# Patient Record
Sex: Male | Born: 1945 | Race: Black or African American | Hispanic: No | Marital: Single | Smoking: Never smoker
Health system: Southern US, Community
[De-identification: ages and names within clinical notes are randomized; demographics above are authoritative.]

## PROBLEM LIST (undated history)

## (undated) DIAGNOSIS — I10 Essential (primary) hypertension: Secondary | ICD-10-CM

## (undated) DIAGNOSIS — C801 Malignant (primary) neoplasm, unspecified: Secondary | ICD-10-CM

---

## 2011-06-12 LAB — POC HEMOCCULT BLD/STL (HOME/3-CARD/SCREEN)

## 2012-04-01 ENCOUNTER — Telehealth: Payer: Self-pay | Admitting: Oncology

## 2012-04-01 NOTE — Telephone Encounter (Signed)
S/W pt in re NP apt 12/03 @ 1:30 w/Dr. Truett Perna.  Welcome packet mailed.

## 2012-04-01 NOTE — Telephone Encounter (Signed)
C/D 04/01/12 for appt. 04/20/12

## 2012-04-20 ENCOUNTER — Encounter: Payer: Self-pay | Admitting: Oncology

## 2012-04-20 ENCOUNTER — Ambulatory Visit (HOSPITAL_BASED_OUTPATIENT_CLINIC_OR_DEPARTMENT_OTHER): Payer: Medicaid - Out of State | Admitting: Oncology

## 2012-04-20 ENCOUNTER — Telehealth: Payer: Self-pay | Admitting: Oncology

## 2012-04-20 ENCOUNTER — Encounter (HOSPITAL_BASED_OUTPATIENT_CLINIC_OR_DEPARTMENT_OTHER): Payer: Medicaid - Out of State | Admitting: *Deleted

## 2012-04-20 ENCOUNTER — Other Ambulatory Visit: Payer: Self-pay | Admitting: *Deleted

## 2012-04-20 ENCOUNTER — Ambulatory Visit: Payer: Medicaid - Out of State

## 2012-04-20 VITALS — BP 133/67 | HR 58 | Temp 96.8°F | Resp 18 | Ht 75.0 in | Wt 181.0 lb

## 2012-04-20 DIAGNOSIS — C189 Malignant neoplasm of colon, unspecified: Secondary | ICD-10-CM

## 2012-04-20 DIAGNOSIS — C787 Secondary malignant neoplasm of liver and intrahepatic bile duct: Secondary | ICD-10-CM

## 2012-04-20 DIAGNOSIS — G620 Drug-induced polyneuropathy: Secondary | ICD-10-CM

## 2012-04-20 DIAGNOSIS — D509 Iron deficiency anemia, unspecified: Secondary | ICD-10-CM

## 2012-04-20 DIAGNOSIS — C182 Malignant neoplasm of ascending colon: Secondary | ICD-10-CM

## 2012-04-20 LAB — CBC WITH DIFFERENTIAL/PLATELET
BASO%: 0.5 % (ref 0.0–2.0)
Eosinophils Absolute: 0.2 10*3/uL (ref 0.0–0.5)
LYMPH%: 32.6 % (ref 14.0–49.0)
MCHC: 31.7 g/dL — ABNORMAL LOW (ref 32.0–36.0)
MONO#: 1.3 10*3/uL — ABNORMAL HIGH (ref 0.1–0.9)
NEUT#: 3.1 10*3/uL (ref 1.5–6.5)
RBC: 3.71 10*6/uL — ABNORMAL LOW (ref 4.20–5.82)
RDW: 23.5 % — ABNORMAL HIGH (ref 11.0–14.6)
WBC: 6.7 10*3/uL (ref 4.0–10.3)
lymph#: 2.2 10*3/uL (ref 0.9–3.3)

## 2012-04-20 LAB — CEA: CEA: 56.5 ng/mL — ABNORMAL HIGH (ref 0.0–5.0)

## 2012-04-20 LAB — COMPREHENSIVE METABOLIC PANEL (CC13)
ALT: 24 U/L (ref 0–55)
AST: 38 U/L — ABNORMAL HIGH (ref 5–34)
Albumin: 4 g/dL (ref 3.5–5.0)
Alkaline Phosphatase: 118 U/L (ref 40–150)
BUN: 28 mg/dL — ABNORMAL HIGH (ref 7.0–26.0)
Chloride: 98 mEq/L (ref 98–107)
Potassium: 4.5 mEq/L (ref 3.5–5.1)
Sodium: 131 mEq/L — ABNORMAL LOW (ref 136–145)

## 2012-04-20 LAB — UA PROTEIN, DIPSTICK - CHCC: Protein, ur: NEGATIVE mg/dL

## 2012-04-20 NOTE — Progress Notes (Signed)
Checked in new pt with no financial concerns. °

## 2012-04-20 NOTE — Progress Notes (Signed)
New pt seeing Dr. Truett Perna was very upset today.  Pt has Medicaid [place] and per pt he was told that his appts and chemo would be covered.  I called Medicaid of [place] to verify and we are not in network with them.  Rep stated that services would be covered only in the state of [place].  I informed the pt of this and he was very distraught saying that he checked on all of this before coming to [facility].  I tried explaining the financial assistance program to him, gave him an EPP application as well as a Medicaid of [place] application.  Patient didn't want to hear anything I was saying so I apologized to the pt for what he was going thru, offered to get upper management which he denied, I left the paperwork and exited the room.  I relayed this to Raquel so she can follow up.
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
## 2012-04-20 NOTE — Telephone Encounter (Signed)
gve the pt his dec 2013 appt calendar along with the oral contrast for the ct scan appt. Pt has the appt to see the nutrionist. S/w abigail the sw who is aware that the pt needs to see her asap

## 2012-04-20 NOTE — Progress Notes (Signed)
Met with patient and explained role of nurse navigator. Patient is relocating to the area from [place] and plans to continue his treatments under care of Dr. Truett Perna.  Patient upset about his finances.  Patient met with FC to work out his concerns.  Referrals made to SW and dietician.  Contact numbers given for staff and resources.  This RN contacted patient's former provider's office to obtain CT scans (which will be mailed out) and to request tissue blocks from original surgery Jan. 2013.  Will continue to follow for needs.

## 2012-04-21 ENCOUNTER — Telehealth: Payer: Self-pay | Admitting: *Deleted

## 2012-04-21 ENCOUNTER — Other Ambulatory Visit: Payer: Self-pay | Admitting: Lab

## 2012-04-21 DIAGNOSIS — C189 Malignant neoplasm of colon, unspecified: Secondary | ICD-10-CM

## 2012-04-21 LAB — FERRITIN: Ferritin: 25 ng/mL (ref 22–322)

## 2012-04-21 NOTE — Telephone Encounter (Signed)
Looked at the patient's schedule and he has been scheduled for his appointments

## 2012-04-22 ENCOUNTER — Encounter: Payer: Self-pay | Admitting: Oncology

## 2012-04-22 NOTE — Progress Notes (Signed)
Returned Mr. Fanning called (x2), no answer left message to call me back.Called [facility] [phone]) spoke with Loney Hering regarding his [place] Medicaid. Per Loney Hering  Mr. Mathes stated he will be working on transferring his Medicaid to [place] with the help of his sister who is a Engineer, civil (consulting). Lenise discussed this issue with Mr. Yamada and advised he will be self pay for all visits until his [place] Medicaid is approve.

## 2012-04-22 NOTE — Progress Notes (Signed)
Received CT scans on disks from previous hospital in [place] where treated.  These were delivered to Radiology Rebecka Apley).  These are to be compared to the CT scan scheduled for 04/23/12.  A request was made for  the disks be returned back to this RN.  Received tumor block from pathology department at previous hospital in [place].  This was sent to [facility] pathology lab and given to Javata Epps who will process and send off for KRAS testing per order from Dr. Truett Perna.

## 2012-04-23 ENCOUNTER — Ambulatory Visit (HOSPITAL_COMMUNITY)
Admission: RE | Admit: 2012-04-23 | Discharge: 2012-04-23 | Disposition: A | Discharge status: Home / Self Care | Payer: Self-pay | Source: Ambulatory Visit | Attending: Oncology | Admitting: Oncology

## 2012-04-23 ENCOUNTER — Other Ambulatory Visit: Payer: Self-pay | Admitting: *Deleted

## 2012-04-23 ENCOUNTER — Other Ambulatory Visit (HOSPITAL_COMMUNITY): Payer: Medicaid - Out of State

## 2012-04-23 ENCOUNTER — Ambulatory Visit (HOSPITAL_BASED_OUTPATIENT_CLINIC_OR_DEPARTMENT_OTHER): Payer: Medicaid - Out of State

## 2012-04-23 ENCOUNTER — Telehealth: Payer: Self-pay | Admitting: *Deleted

## 2012-04-23 DIAGNOSIS — C189 Malignant neoplasm of colon, unspecified: Secondary | ICD-10-CM

## 2012-04-23 DIAGNOSIS — C182 Malignant neoplasm of ascending colon: Secondary | ICD-10-CM

## 2012-04-23 LAB — BASIC METABOLIC PANEL (CC13)
BUN: 23 mg/dL (ref 7.0–26.0)
Calcium: 9.3 mg/dL (ref 8.4–10.4)
Chloride: 103 mEq/L (ref 98–107)
Creatinine: 1.5 mg/dL — ABNORMAL HIGH (ref 0.7–1.3)

## 2012-04-23 MED ORDER — IOHEXOL 300 MG/ML  SOLN
100.0000 mL | Freq: Once | INTRAMUSCULAR | Status: AC | PRN
  Administered 2012-04-23: 100 mL via INTRAVENOUS

## 2012-04-23 NOTE — Telephone Encounter (Signed)
Message copied by Gala Romney on Fri Apr 23, 2012  1:29 PM ------      Message from: Thornton Papas B      Created: Wed Apr 21, 2012 10:43 PM       Scheduled for CT 12/6, needs repeat bmet 12/5 or stat 12/6 prior to CT

## 2012-04-23 NOTE — Telephone Encounter (Signed)
Called and left message for pt to come in for lab work prior to CT Scan today at 3:30.

## 2012-04-26 ENCOUNTER — Telehealth: Payer: Self-pay | Admitting: *Deleted

## 2012-04-26 ENCOUNTER — Other Ambulatory Visit: Payer: Self-pay | Admitting: Oncology

## 2012-04-26 ENCOUNTER — Ambulatory Visit
Admission: RE | Admit: 2012-04-26 | Discharge: 2012-04-26 | Disposition: A | Discharge status: Home / Self Care | Payer: Self-pay | Source: Ambulatory Visit | Attending: Oncology | Admitting: Oncology

## 2012-04-26 ENCOUNTER — Ambulatory Visit (HOSPITAL_BASED_OUTPATIENT_CLINIC_OR_DEPARTMENT_OTHER): Payer: Medicaid - Out of State | Admitting: Nurse Practitioner

## 2012-04-26 VITALS — BP 162/83 | HR 52 | Temp 98.5°F | Resp 20 | Ht 75.0 in | Wt 188.3 lb

## 2012-04-26 DIAGNOSIS — C229 Malignant neoplasm of liver, not specified as primary or secondary: Secondary | ICD-10-CM

## 2012-04-26 DIAGNOSIS — G620 Drug-induced polyneuropathy: Secondary | ICD-10-CM

## 2012-04-26 DIAGNOSIS — C787 Secondary malignant neoplasm of liver and intrahepatic bile duct: Secondary | ICD-10-CM

## 2012-04-26 DIAGNOSIS — C189 Malignant neoplasm of colon, unspecified: Secondary | ICD-10-CM

## 2012-04-26 DIAGNOSIS — C182 Malignant neoplasm of ascending colon: Secondary | ICD-10-CM

## 2012-04-26 DIAGNOSIS — D509 Iron deficiency anemia, unspecified: Secondary | ICD-10-CM
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
## 2012-04-26 NOTE — Telephone Encounter (Signed)
CT scans done 04/24/12 need to be compared to outside scans per request of Dr. Truett Perna.  Spoke with Rebecka Apley in Radiology.  She is aware patient comes in today and that Dr. Truett Perna needs the information.

## 2012-04-27 ENCOUNTER — Telehealth: Payer: Self-pay | Admitting: *Deleted

## 2012-04-27 ENCOUNTER — Encounter: Payer: Self-pay | Admitting: *Deleted

## 2012-04-27 DIAGNOSIS — C189 Malignant neoplasm of colon, unspecified: Secondary | ICD-10-CM

## 2012-04-27 NOTE — Telephone Encounter (Signed)
Made patient appointment for 05-17-2012 starting at 9:00am   Sent michelle email to set up patient's treatment on 05-03-2012 and 05-17-2012

## 2012-04-27 NOTE — Telephone Encounter (Signed)
Patient confirmed over the phone the new date and time on 05-04-2012 at 9:30am

## 2012-04-27 NOTE — Telephone Encounter (Signed)
Per staff message and POF I have scheduled appts. Per Dr. Truett Perna ok to move to 12/17.  JMW

## 2012-04-28 ENCOUNTER — Telehealth: Payer: Self-pay | Admitting: Oncology

## 2012-04-28 ENCOUNTER — Encounter: Payer: Medicaid - Out of State | Admitting: *Deleted

## 2012-04-28 ENCOUNTER — Other Ambulatory Visit: Payer: Medicaid - Out of State | Admitting: Lab

## 2012-04-28 ENCOUNTER — Ambulatory Visit: Payer: Medicaid - Out of State | Admitting: Nutrition

## 2012-04-28 ENCOUNTER — Other Ambulatory Visit: Payer: Self-pay | Admitting: *Deleted

## 2012-04-28 NOTE — Telephone Encounter (Signed)
Chemo class made and printed for [facility]

## 2012-04-28 NOTE — Progress Notes (Signed)
Patient is a 66 year old male diagnosed with metastatic colon cancer receiving FOLFIRI. He is a patient of Dr. Nida Boatman Sherrill's.  Past medical history includes a right colectomy and microcytic anemia.  Medications include Prozac, Ativan, and Phenergan.  Labs include sodium of 135 and creatinine of 1.5 on December 6.  Height: 6 feet 3 inches. Weight: 188.3 pounds December 9. Usual body weight: 190-195 pounds. BMI: 23.54.  Patient reports he lost 60 pounds approximately one year ago. It has taken him all this time to regain his weight and to feel better. He is to begin chemotherapy this month. Patient reports dietary recall of a protein juice drink at breakfast and lunch. He consumes snacks utilizing higher protein foods throughout the day. He typically eats a normal dinner consisting of protein, vegetables, and carbohydrates. He has no side effects at this time. He does utilize a whey protein powder providing 60 g of protein daily.  Diagnosis: Food and nutrition related knowledge deficit related to diagnosis of metastatic colon cancer and associated treatments as evidenced by lack of prior exposure to accurate nutrition related formation.  Intervention: I educated patient on important protein sources in his diet. I have encouraged him to consume small frequent meals and snacks throughout the day. He could benefit from the additional protein that the protein powder is providing so he is to continue this for now. I have provided education on potential side effects and how to manage them nutritionally. He has fact sheets in my contact information for questions.  Monitoring, evaluation, goals: Patient will tolerate oral diet to promote weight maintenance throughout treatment.  Next visit: Monday, December 30, during chemotherapy.

## 2012-04-29 ENCOUNTER — Encounter: Payer: Self-pay | Admitting: *Deleted

## 2012-04-29 ENCOUNTER — Ambulatory Visit: Payer: Medicaid - Out of State

## 2012-04-29 DIAGNOSIS — C189 Malignant neoplasm of colon, unspecified: Secondary | ICD-10-CM

## 2012-04-29 MED ORDER — FLUOXETINE HCL 20 MG PO CAPS: 20.0000 mg | ORAL_CAPSULE | Freq: Every day | ORAL | Status: DC

## 2012-04-30 ENCOUNTER — Encounter: Payer: Self-pay | Admitting: Oncology

## 2012-04-30 NOTE — Progress Notes (Signed)
Will have patient bring letter of support in for his financial application. He gets 730.00 per month, but his rent is 600.00 who helps with other bills. He is back in office 05/04/12.

## 2012-04-30 NOTE — Progress Notes (Signed)
Processed patient's application for assistance. Patient is new in town and lives with his sister. 1 family income 25.  I will send letter/green card 100% 04/30/12-10/29/12.

## 2012-05-04 ENCOUNTER — Other Ambulatory Visit: Payer: Self-pay | Admitting: Oncology

## 2012-05-04 ENCOUNTER — Ambulatory Visit (HOSPITAL_BASED_OUTPATIENT_CLINIC_OR_DEPARTMENT_OTHER): Payer: Medicaid - Out of State

## 2012-05-04 ENCOUNTER — Encounter: Payer: Self-pay | Admitting: Nutrition

## 2012-05-04 ENCOUNTER — Other Ambulatory Visit: Payer: Self-pay | Admitting: Medical Oncology

## 2012-05-04 VITALS — BP 148/76 | HR 58 | Temp 97.0°F | Resp 20

## 2012-05-04 DIAGNOSIS — C189 Malignant neoplasm of colon, unspecified: Secondary | ICD-10-CM

## 2012-05-04 DIAGNOSIS — Z5111 Encounter for antineoplastic chemotherapy: Secondary | ICD-10-CM

## 2012-05-04 DIAGNOSIS — C787 Secondary malignant neoplasm of liver and intrahepatic bile duct: Secondary | ICD-10-CM

## 2012-05-04 DIAGNOSIS — C182 Malignant neoplasm of ascending colon: Secondary | ICD-10-CM

## 2012-05-04 MED ORDER — SODIUM CHLORIDE 0.9 % IJ SOLN
10.0000 mL | INTRAMUSCULAR | Status: DC | PRN
  Filled 2012-05-04: qty 10

## 2012-05-04 MED ORDER — SODIUM CHLORIDE 0.9 % IV SOLN
Freq: Once | INTRAVENOUS | Status: AC
  Administered 2012-05-04: 10:00:00 via INTRAVENOUS

## 2012-05-04 MED ORDER — HEPARIN SOD (PORK) LOCK FLUSH 100 UNIT/ML IV SOLN
500.0000 [IU] | Freq: Once | INTRAVENOUS | Status: DC | PRN
  Filled 2012-05-04: qty 5

## 2012-05-04 MED ORDER — ATROPINE SULFATE 1 MG/ML IJ SOLN: 0.5000 mg | Freq: Once | INTRAMUSCULAR | Status: DC | PRN

## 2012-05-04 MED ORDER — DEXAMETHASONE SODIUM PHOSPHATE 4 MG/ML IJ SOLN
20.0000 mg | Freq: Once | INTRAMUSCULAR | Status: AC
  Administered 2012-05-04: 20 mg via INTRAVENOUS

## 2012-05-04 MED ORDER — IRINOTECAN HCL CHEMO INJECTION 100 MG/5ML
180.0000 mg/m2 | Freq: Once | INTRAVENOUS | Status: AC
  Administered 2012-05-04: 384 mg via INTRAVENOUS
  Filled 2012-05-04: qty 19.2

## 2012-05-04 MED ORDER — FLUOROURACIL CHEMO INJECTION 2.5 GM/50ML
400.0000 mg/m2 | Freq: Once | INTRAVENOUS | Status: AC
  Administered 2012-05-04: 850 mg via INTRAVENOUS
  Filled 2012-05-04: qty 17

## 2012-05-04 MED ORDER — SODIUM CHLORIDE 0.9 % IV SOLN
2400.0000 mg/m2 | INTRAVENOUS | Status: DC
  Administered 2012-05-04: 5100 mg via INTRAVENOUS
  Filled 2012-05-04: qty 102

## 2012-05-04 MED ORDER — LEUCOVORIN CALCIUM INJECTION 350 MG
850.0000 mg | Freq: Once | INTRAVENOUS | Status: AC
  Administered 2012-05-04: 850 mg via INTRAVENOUS
  Filled 2012-05-04: qty 42.5

## 2012-05-04 MED ORDER — ONDANSETRON 16 MG/50ML IVPB (CHCC)
16.0000 mg | Freq: Once | INTRAVENOUS | Status: AC
  Administered 2012-05-04: 16 mg via INTRAVENOUS

## 2012-05-04 NOTE — Patient Instructions (Addendum)
[facility] Discharge Instructions for Patients Receiving Chemotherapy  Today you received the following chemotherapy agents folfiri  To help prevent nausea and vomiting after your treatment, we encourage you to take your nausea medication  and take it as often as prescribed    If you develop nausea and vomiting that is not controlled by your nausea medication, call the clinic. If it is after clinic hours your family physician or the after hours number for the clinic or go to the Emergency Department.   BELOW ARE SYMPTOMS THAT SHOULD BE REPORTED IMMEDIATELY:  *FEVER GREATER THAN 100.5 F  *CHILLS WITH OR WITHOUT FEVER  NAUSEA AND VOMITING THAT IS NOT CONTROLLED WITH YOUR NAUSEA MEDICATION  *UNUSUAL SHORTNESS OF BREATH  *UNUSUAL BRUISING OR BLEEDING  TENDERNESS IN MOUTH AND THROAT WITH OR WITHOUT PRESENCE OF ULCERS  *URINARY PROBLEMS  *BOWEL PROBLEMS  UNUSUAL RASH Items with * indicate a potential emergency and should be followed up as soon as possible.  One of the nurses will contact you 24 hours after your treatment. Please let the nurse know about any problems that you may have experienced. Feel free to call the clinic you have any questions or concerns. The clinic phone number is [phone].   I have been informed and understand all the instructions given to me. I know to contact the clinic, my physician, or go to the Emergency Department if any problems should occur. I do not have any questions at this time, but understand that I may call the clinic during office hours   should I have any questions or need assistance in obtaining follow up care.    __________________________________________  _____________  __________ Signature of Patient or Authorized Representative            Date                   Time    __________________________________________ Nurse's Signature

## 2012-05-04 NOTE — Progress Notes (Signed)
Social worker made me aware patient is out of protein powder. I visited patient in the chemotherapy area who reports finances are a problem for him. I have provided some samples of protein powder along with one complementary case of Ensure Plus. Patient expresses appreciation.  Next visit: Monday, December 30, during chemotherapy.

## 2012-05-05 ENCOUNTER — Encounter: Payer: Self-pay | Admitting: Oncology

## 2012-05-05 ENCOUNTER — Telehealth: Payer: Self-pay | Admitting: *Deleted

## 2012-05-05 NOTE — Progress Notes (Signed)
Patient came in and I advised him again he has the grant 400.00 and has already used some of it. He has received meds and we(Lenise) gave him a gas card 50.00. He inquired about getting rent paid. I advised him need copy of his rental agreement with his name on it. I also gave him the w-9 for his landlord to fill out. I advised him 2-3 weeks for processing. Patient was advised that only cancer treatment meds can be purchased with the [facility] fund. He wanted to get blood pressure and some other with it. I advised him must be prescribed by our doctors to use at pharmacy.

## 2012-05-05 NOTE — Telephone Encounter (Signed)
Received message from pt requesting re-fill of meds.  Called and spoke with pt---he is requesting re-fill of Cardizem.  States he can't get in to see Dr. Chilton Si until January and wants Dr. Truett Perna to refill.  Informed pt that he would have to get his PCP from [place] to re-order this medicine;  Explained that Dr. Truett Perna is following pt's oncology issues and his PCP or cardiologist from [place] would need to address this.  Pt verbalized understanding.

## 2012-05-06 ENCOUNTER — Other Ambulatory Visit: Payer: Self-pay | Admitting: *Deleted

## 2012-05-06 ENCOUNTER — Ambulatory Visit (HOSPITAL_BASED_OUTPATIENT_CLINIC_OR_DEPARTMENT_OTHER): Payer: Medicaid - Out of State

## 2012-05-06 ENCOUNTER — Telehealth: Payer: Self-pay | Admitting: Oncology

## 2012-05-06 VITALS — BP 173/89 | HR 68 | Temp 98.2°F

## 2012-05-06 DIAGNOSIS — C182 Malignant neoplasm of ascending colon: Secondary | ICD-10-CM

## 2012-05-06 DIAGNOSIS — C787 Secondary malignant neoplasm of liver and intrahepatic bile duct: Secondary | ICD-10-CM

## 2012-05-06 DIAGNOSIS — C189 Malignant neoplasm of colon, unspecified: Secondary | ICD-10-CM

## 2012-05-06 DIAGNOSIS — Z452 Encounter for adjustment and management of vascular access device: Secondary | ICD-10-CM

## 2012-05-06 MED ORDER — GABAPENTIN 300 MG PO CAPS: 300.0000 mg | ORAL_CAPSULE | Freq: Every day | ORAL | Status: DC

## 2012-05-06 MED ORDER — SODIUM CHLORIDE 0.9 % IJ SOLN
10.0000 mL | INTRAMUSCULAR | Status: DC | PRN
  Administered 2012-05-06: 10 mL
  Filled 2012-05-06: qty 10

## 2012-05-06 MED ORDER — HEPARIN SOD (PORK) LOCK FLUSH 100 UNIT/ML IV SOLN
500.0000 [IU] | Freq: Once | INTRAVENOUS | Status: AC | PRN
  Administered 2012-05-06: 500 [IU]
  Filled 2012-05-06: qty 5

## 2012-05-06 MED ORDER — SODIUM CHLORIDE 0.9 % IJ SOLN
10.0000 mL | INTRAMUSCULAR | Status: DC | PRN
  Filled 2012-05-06: qty 10

## 2012-05-06 MED ORDER — HEPARIN SOD (PORK) LOCK FLUSH 100 UNIT/ML IV SOLN
500.0000 [IU] | Freq: Once | INTRAVENOUS | Status: DC
  Filled 2012-05-06: qty 5

## 2012-05-06 NOTE — Telephone Encounter (Signed)
Added pump d/c appt for today. Pt tx'd 12/17 and did not have a d/c appt. Also moved 12/30 folfox appt to 12/31 due to center is closed 05/19/12 and pump cannot be stopped that day. Pt aware of changes and given new appts schedule for December and January (05/17/12, 05/18/12, and 05/20/12).

## 2012-05-16 ENCOUNTER — Other Ambulatory Visit: Payer: Self-pay | Admitting: Oncology

## 2012-05-17 ENCOUNTER — Other Ambulatory Visit: Payer: Self-pay | Admitting: Oncology

## 2012-05-17 ENCOUNTER — Encounter: Payer: Medicaid - Out of State | Admitting: Nutrition

## 2012-05-17 ENCOUNTER — Telehealth: Payer: Self-pay | Admitting: Oncology

## 2012-05-17 ENCOUNTER — Telehealth: Payer: Self-pay | Admitting: *Deleted

## 2012-05-17 ENCOUNTER — Other Ambulatory Visit (HOSPITAL_BASED_OUTPATIENT_CLINIC_OR_DEPARTMENT_OTHER): Payer: Medicaid - Out of State | Admitting: Lab

## 2012-05-17 ENCOUNTER — Ambulatory Visit (HOSPITAL_BASED_OUTPATIENT_CLINIC_OR_DEPARTMENT_OTHER): Payer: Medicaid - Out of State | Admitting: Oncology

## 2012-05-17 ENCOUNTER — Inpatient Hospital Stay: Payer: Medicaid - Out of State

## 2012-05-17 VITALS — BP 153/70 | HR 58 | Temp 97.9°F | Resp 18 | Ht 75.0 in | Wt 185.5 lb

## 2012-05-17 DIAGNOSIS — C182 Malignant neoplasm of ascending colon: Secondary | ICD-10-CM

## 2012-05-17 DIAGNOSIS — C189 Malignant neoplasm of colon, unspecified: Secondary | ICD-10-CM

## 2012-05-17 DIAGNOSIS — C787 Secondary malignant neoplasm of liver and intrahepatic bile duct: Secondary | ICD-10-CM

## 2012-05-17 DIAGNOSIS — D649 Anemia, unspecified: Secondary | ICD-10-CM

## 2012-05-17 DIAGNOSIS — G620 Drug-induced polyneuropathy: Secondary | ICD-10-CM

## 2012-05-17 LAB — CBC WITH DIFFERENTIAL/PLATELET
BASO%: 0.5 % (ref 0.0–2.0)
EOS%: 6.1 % (ref 0.0–7.0)
MCH: 25.1 pg — ABNORMAL LOW (ref 27.2–33.4)
MCHC: 32.1 g/dL (ref 32.0–36.0)
RBC: 3.88 10*6/uL — ABNORMAL LOW (ref 4.20–5.82)
RDW: 22.6 % — ABNORMAL HIGH (ref 11.0–14.6)
lymph#: 1.7 10*3/uL (ref 0.9–3.3)

## 2012-05-17 LAB — COMPREHENSIVE METABOLIC PANEL (CC13)
ALT: 30 U/L (ref 0–55)
AST: 37 U/L — ABNORMAL HIGH (ref 5–34)
Albumin: 3.5 g/dL (ref 3.5–5.0)
Calcium: 9.1 mg/dL (ref 8.4–10.4)
Chloride: 106 mEq/L (ref 98–107)
Creatinine: 1.2 mg/dL (ref 0.7–1.3)
Potassium: 4.6 mEq/L (ref 3.5–5.1)
Sodium: 139 mEq/L (ref 136–145)

## 2012-05-17 NOTE — Progress Notes (Signed)
   Nathan Robbins    OFFICE PROGRESS NOTE   INTERVAL HISTORY:   He completed a first cycle of FOLFIRI on 02/03/2012. Nathan Robbins reports tolerating the chemotherapy well. He denies mouth sores, nausea, and diarrhea. Persistent neuropathy symptoms in the feet. He feels well.  Objective:  Vital signs in last 24 hours:  Blood pressure 153/70, pulse 58, temperature 97.9 F (36.6 C), temperature source Oral, resp. rate 18, height 6\' 3"  (1.905 m), weight 185 lb 8 oz (84.142 kg).    HEENT: No thrush or ulcers  Cardio: Regular rate and rhythm GI: The liver is palpable in the right upper abdomen, most prominent in the medial right subcostal region Vascular: No leg edema Lungs: Clear bilaterally   Portacath/PICC-without erythema  Lab Results:  Lab Results  Component Value Date   WBC 5.7 05/17/2012   HGB 9.7* 05/17/2012   HCT 30.3* 05/17/2012   MCV 78.1* 05/17/2012   PLT 314 05/17/2012   ANC 3.0  CEA on 04/20/2012-56.5    Medications: I have reviewed the patient's current medications.  Assessment/Plan: 1. Metastatic colon cancer diagnosed in January 2013, right colon mass (T3 N1), status post right colectomy and biopsy of a metastatic liver lesion. Status post 11 cycles of FOLFOX/Avastin with restaging CT on 01/15/2012 revealing stable metastatic disease involving the liver. Chemotherapy switched to weekly 5-FU/leucovorin and every 2 week Avastin beginning on 02/18/2012; last treated with 5-FU/leucovorin on 03/24/2012 and last treated with Avastin on 03/31/2012. Restaging CT evaluation 04/23/2012 showed increased hepatic metastatic disease. -Cycle 1 of FOLFIRI chemotherapy 05/04/2012 2. Microcytic anemia. Question iron deficiency. Ferritin was normal on 04/20/2012. 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 He  will complete cycle 2 today. Nathan Robbins will return for an office visit and chemotherapy in 2 weeks. We will obtain a restaging CT evaluation after 5-6 cycles of FOLFIRI.   Thornton Papas, MD  05/17/2012  6:30 PM

## 2012-05-17 NOTE — Telephone Encounter (Signed)
Gave pt appt for lab and MD for January 2014 , waiting for [facility] for chemo appt, pt will come back tomorrow to get full calendar

## 2012-05-17 NOTE — Telephone Encounter (Signed)
Per staff message and POF I have a schedueld appts. JMW

## 2012-05-18 ENCOUNTER — Telehealth: Payer: Self-pay | Admitting: *Deleted

## 2012-05-18 ENCOUNTER — Encounter: Payer: Medicaid - Out of State | Admitting: Nutrition

## 2012-05-18 ENCOUNTER — Inpatient Hospital Stay: Payer: Medicaid - Out of State

## 2012-05-18 NOTE — Telephone Encounter (Signed)
[facility] for chemo appointment today. Left VM for patient to call scheduling department to reschedule.

## 2012-05-20 ENCOUNTER — Telehealth: Payer: Self-pay | Admitting: *Deleted

## 2012-05-20 NOTE — Telephone Encounter (Signed)
Per staff phone call from desk RN, patient missed his appt on 12/31. Patient to receive foliri, I have sent message backa to desk RN to give date to schedule.  Cant schedule tomorrow sue to tomorrow is Friday and no pump d/c on Sundays. JMW

## 2012-05-20 NOTE — Telephone Encounter (Signed)
Left message on known voice mail regarding 05/26/12 appt due to cancellation on 05/18/12.  Instructed pt to call when message received to confirm appt date/time.

## 2012-05-20 NOTE — Telephone Encounter (Signed)
Per desk RN patient to to be scheduled next week. Appts made, desk RN to call patient.  JMW

## 2012-05-20 NOTE — Telephone Encounter (Signed)
Received call from pt requesting to reschedule chemo missed 05/18/12 due to sickness.  Message left on [facility] voice mail and POF completed.

## 2012-05-25 ENCOUNTER — Encounter: Payer: Self-pay | Admitting: Pharmacist

## 2012-05-26 ENCOUNTER — Other Ambulatory Visit (HOSPITAL_BASED_OUTPATIENT_CLINIC_OR_DEPARTMENT_OTHER): Payer: Medicaid - Out of State | Admitting: Lab

## 2012-05-26 ENCOUNTER — Ambulatory Visit (HOSPITAL_BASED_OUTPATIENT_CLINIC_OR_DEPARTMENT_OTHER): Payer: Medicaid - Out of State

## 2012-05-26 ENCOUNTER — Other Ambulatory Visit: Payer: Self-pay | Admitting: Oncology

## 2012-05-26 VITALS — BP 128/74 | HR 66 | Temp 98.7°F

## 2012-05-26 DIAGNOSIS — C182 Malignant neoplasm of ascending colon: Secondary | ICD-10-CM

## 2012-05-26 DIAGNOSIS — C189 Malignant neoplasm of colon, unspecified: Secondary | ICD-10-CM

## 2012-05-26 DIAGNOSIS — C787 Secondary malignant neoplasm of liver and intrahepatic bile duct: Secondary | ICD-10-CM

## 2012-05-26 DIAGNOSIS — Z5111 Encounter for antineoplastic chemotherapy: Secondary | ICD-10-CM

## 2012-05-26 LAB — CBC WITH DIFFERENTIAL/PLATELET
HCT: 30.9 % — ABNORMAL LOW (ref 38.4–49.9)
HGB: 9.7 g/dL — ABNORMAL LOW (ref 13.0–17.1)
LYMPH%: 27.3 % (ref 14.0–49.0)
MCH: 24.3 pg — ABNORMAL LOW (ref 27.2–33.4)
MCHC: 31.5 g/dL — ABNORMAL LOW (ref 32.0–36.0)
MONO#: 1.3 10*3/uL — ABNORMAL HIGH (ref 0.1–0.9)
MONO%: 20.5 % — ABNORMAL HIGH (ref 0.0–14.0)
NEUT%: 47.8 % (ref 39.0–75.0)
RDW: 21.8 % — ABNORMAL HIGH (ref 11.0–14.6)
WBC: 6.3 10*3/uL (ref 4.0–10.3)
lymph#: 1.7 10*3/uL (ref 0.9–3.3)

## 2012-05-26 LAB — COMPREHENSIVE METABOLIC PANEL (CC13)
Alkaline Phosphatase: 145 U/L (ref 40–150)
BUN: 14 mg/dL (ref 7.0–26.0)
Creatinine: 1.3 mg/dL (ref 0.7–1.3)
Glucose: 105 mg/dl — ABNORMAL HIGH (ref 70–99)
Sodium: 135 mEq/L — ABNORMAL LOW (ref 136–145)
Total Bilirubin: 0.23 mg/dL (ref 0.20–1.20)

## 2012-05-26 MED ORDER — DEXAMETHASONE SODIUM PHOSPHATE 10 MG/ML IJ SOLN
20.0000 mg | Freq: Once | INTRAMUSCULAR | Status: AC
  Administered 2012-05-26: 20 mg via INTRAVENOUS

## 2012-05-26 MED ORDER — ONDANSETRON 16 MG/50ML IVPB (CHCC)
16.0000 mg | Freq: Once | INTRAVENOUS | Status: AC
  Administered 2012-05-26: 16 mg via INTRAVENOUS

## 2012-05-26 MED ORDER — ATROPINE SULFATE 1 MG/ML IJ SOLN
0.5000 mg | Freq: Once | INTRAMUSCULAR | Status: AC | PRN
  Administered 2012-05-26: 0.5 mg via INTRAVENOUS

## 2012-05-26 MED ORDER — LEUCOVORIN CALCIUM INJECTION 350 MG
400.0000 mg/m2 | Freq: Once | INTRAVENOUS | Status: AC
  Administered 2012-05-26: 852 mg via INTRAVENOUS
  Filled 2012-05-26: qty 42.6

## 2012-05-26 MED ORDER — IRINOTECAN HCL CHEMO INJECTION 100 MG/5ML
180.0000 mg/m2 | Freq: Once | INTRAVENOUS | Status: AC
  Administered 2012-05-26: 384 mg via INTRAVENOUS
  Filled 2012-05-26: qty 19.2

## 2012-05-26 MED ORDER — SODIUM CHLORIDE 0.9 % IV SOLN
2400.0000 mg/m2 | INTRAVENOUS | Status: DC
  Administered 2012-05-26: 5100 mg via INTRAVENOUS
  Filled 2012-05-26: qty 102

## 2012-05-26 MED ORDER — SODIUM CHLORIDE 0.9 % IV SOLN
Freq: Once | INTRAVENOUS | Status: AC
  Administered 2012-05-26: 13:00:00 via INTRAVENOUS

## 2012-05-26 MED ORDER — FLUOROURACIL CHEMO INJECTION 2.5 GM/50ML
400.0000 mg/m2 | Freq: Once | INTRAVENOUS | Status: AC
  Administered 2012-05-26: 850 mg via INTRAVENOUS
  Filled 2012-05-26: qty 17

## 2012-05-26 NOTE — Patient Instructions (Addendum)
[facility] Discharge Instructions for Patients Receiving Chemotherapy  Today you received the following chemotherapy agents Camptosar, Leucovorin and 5FU  To help prevent nausea and vomiting after your treatment, we encourage you to take your nausea medication as prescribed.   If you develop nausea and vomiting that is not controlled by your nausea medication, call the clinic. If it is after clinic hours your family physician or the after hours number for the clinic or go to the Emergency Department.   BELOW ARE SYMPTOMS THAT SHOULD BE REPORTED IMMEDIATELY:  *FEVER GREATER THAN 100.5 F  *CHILLS WITH OR WITHOUT FEVER  NAUSEA AND VOMITING THAT IS NOT CONTROLLED WITH YOUR NAUSEA MEDICATION  *UNUSUAL SHORTNESS OF BREATH  *UNUSUAL BRUISING OR BLEEDING  TENDERNESS IN MOUTH AND THROAT WITH OR WITHOUT PRESENCE OF ULCERS  *URINARY PROBLEMS  *BOWEL PROBLEMS  UNUSUAL RASH Items with * indicate a potential emergency and should be followed up as soon as possible.  Feel free to call the clinic you have any questions or concerns. The clinic phone number is [phone].   I have been informed and understand all the instructions given to me. I know to contact the clinic, my physician, or go to the Emergency Department if any problems should occur. I do not have any questions at this time, but understand that I may call the clinic during office hours   should I have any questions or need assistance in obtaining follow up care.    __________________________________________  _____________  __________ Signature of Patient or Authorized Representative            Date                   Time    __________________________________________ Nurse's Signature    

## 2012-05-27 ENCOUNTER — Telehealth: Payer: Self-pay | Admitting: Oncology

## 2012-05-27 ENCOUNTER — Ambulatory Visit: Payer: Medicaid - Out of State | Admitting: Nurse Practitioner

## 2012-05-27 NOTE — Telephone Encounter (Signed)
Talked to patient, he will come and get calendar appt for rest of January 2014 tomorrow

## 2012-05-28 ENCOUNTER — Other Ambulatory Visit: Payer: Self-pay | Admitting: *Deleted

## 2012-05-28 ENCOUNTER — Ambulatory Visit (HOSPITAL_BASED_OUTPATIENT_CLINIC_OR_DEPARTMENT_OTHER): Payer: Medicaid - Out of State

## 2012-05-28 VITALS — BP 136/85 | HR 82 | Temp 97.5°F

## 2012-05-28 DIAGNOSIS — C787 Secondary malignant neoplasm of liver and intrahepatic bile duct: Secondary | ICD-10-CM

## 2012-05-28 DIAGNOSIS — C182 Malignant neoplasm of ascending colon: Secondary | ICD-10-CM

## 2012-05-28 DIAGNOSIS — C189 Malignant neoplasm of colon, unspecified: Secondary | ICD-10-CM

## 2012-05-28 MED ORDER — HEPARIN SOD (PORK) LOCK FLUSH 100 UNIT/ML IV SOLN
500.0000 [IU] | Freq: Once | INTRAVENOUS | Status: AC | PRN
  Administered 2012-05-28: 500 [IU]
  Filled 2012-05-28: qty 5

## 2012-05-28 MED ORDER — PROMETHAZINE HCL 25 MG PO TABS: 25.0000 mg | ORAL_TABLET | Freq: Three times a day (TID) | ORAL | Status: DC | PRN

## 2012-05-28 MED ORDER — SODIUM CHLORIDE 0.9 % IJ SOLN
10.0000 mL | INTRAMUSCULAR | Status: DC | PRN
  Administered 2012-05-28: 10 mL
  Filled 2012-05-28: qty 10

## 2012-05-28 NOTE — Telephone Encounter (Signed)
Call from patient while at pharmacy waiting for meds. Patient was instructed yesterday to call pharmacy for refill, now very agitated that pharmacy did not have this.

## 2012-05-28 NOTE — Telephone Encounter (Signed)
Message from pharmacy: pt is requesting refill on Promethazine. Pt in lobby requesting to see nurse for refill as well. Informed pt that he should call pharmacy to request the refill and usually it is available by the next day. He seemed agitated, said he needed a schedule. Took pt to get a copy of schedule and he walked out, stating he needed to use the restroom.

## 2012-05-28 NOTE — Patient Instructions (Signed)
All MD for problems

## 2012-05-31 ENCOUNTER — Encounter: Payer: Self-pay | Admitting: Oncology

## 2012-05-31 ENCOUNTER — Other Ambulatory Visit: Payer: Self-pay | Admitting: Nurse Practitioner

## 2012-05-31 DIAGNOSIS — C189 Malignant neoplasm of colon, unspecified: Secondary | ICD-10-CM

## 2012-05-31 NOTE — Progress Notes (Signed)
Called and left message for patient. I have w-9 form and rental agreement. He is wanting rent paid. He has balance of 230.40 left in [facility] fund. I called to advise him and see how much of that he wants paid. I must advise him if he uses he will not have any more left for his meds.

## 2012-06-01 ENCOUNTER — Encounter: Payer: Self-pay | Admitting: Oncology

## 2012-06-01 ENCOUNTER — Telehealth: Payer: Self-pay | Admitting: Oncology

## 2012-06-01 ENCOUNTER — Inpatient Hospital Stay: Payer: Medicaid - Out of State

## 2012-06-01 ENCOUNTER — Ambulatory Visit: Payer: Medicaid - Out of State | Admitting: Nutrition

## 2012-06-01 ENCOUNTER — Telehealth: Payer: Self-pay | Admitting: *Deleted

## 2012-06-01 ENCOUNTER — Ambulatory Visit (HOSPITAL_BASED_OUTPATIENT_CLINIC_OR_DEPARTMENT_OTHER): Payer: Medicaid - Out of State | Admitting: Nurse Practitioner

## 2012-06-01 ENCOUNTER — Other Ambulatory Visit (HOSPITAL_BASED_OUTPATIENT_CLINIC_OR_DEPARTMENT_OTHER): Payer: Medicaid - Out of State | Admitting: Lab

## 2012-06-01 VITALS — BP 138/78 | HR 65 | Temp 97.2°F | Resp 18 | Ht 75.0 in | Wt 182.8 lb

## 2012-06-01 DIAGNOSIS — C182 Malignant neoplasm of ascending colon: Secondary | ICD-10-CM

## 2012-06-01 DIAGNOSIS — C189 Malignant neoplasm of colon, unspecified: Secondary | ICD-10-CM

## 2012-06-01 DIAGNOSIS — G620 Drug-induced polyneuropathy: Secondary | ICD-10-CM

## 2012-06-01 DIAGNOSIS — D649 Anemia, unspecified: Secondary | ICD-10-CM

## 2012-06-01 DIAGNOSIS — C787 Secondary malignant neoplasm of liver and intrahepatic bile duct: Secondary | ICD-10-CM

## 2012-06-01 LAB — COMPREHENSIVE METABOLIC PANEL (CC13)
BUN: 20 mg/dL (ref 7.0–26.0)
CO2: 24 mEq/L (ref 22–29)
Calcium: 8.8 mg/dL (ref 8.4–10.4)
Chloride: 103 mEq/L (ref 98–107)
Creatinine: 1.6 mg/dL — ABNORMAL HIGH (ref 0.7–1.3)
Glucose: 122 mg/dl — ABNORMAL HIGH (ref 70–99)

## 2012-06-01 LAB — CBC WITH DIFFERENTIAL/PLATELET
Basophils Absolute: 0 10*3/uL (ref 0.0–0.1)
HCT: 30.1 % — ABNORMAL LOW (ref 38.4–49.9)
HGB: 9.4 g/dL — ABNORMAL LOW (ref 13.0–17.1)
MONO#: 0.2 10*3/uL (ref 0.1–0.9)
NEUT%: 53.1 % (ref 39.0–75.0)
Platelets: 435 10*3/uL — ABNORMAL HIGH (ref 140–400)
WBC: 3.9 10*3/uL — ABNORMAL LOW (ref 4.0–10.3)
lymph#: 1.5 10*3/uL (ref 0.9–3.3)

## 2012-06-01 MED ORDER — FLUOXETINE HCL 20 MG PO CAPS: 20.0000 mg | ORAL_CAPSULE | Freq: Every day | ORAL | Status: DC

## 2012-06-01 NOTE — Progress Notes (Signed)
OFFICE PROGRESS NOTE  Interval history:  Mr. Saladin returns as scheduled. He completed cycle 2 FOLFIRI on 05/27/2011. He developed nausea on the day of pump discontinuation. The nausea was better yesterday. He is taking Phenergan as needed. He denies diarrhea. No mouth sores. He has persistent neuropathy symptoms involving the feet.   Objective: Blood pressure 138/78, pulse 65, temperature 97.2 F (36.2 C), temperature source Oral, resp. rate 18, height 6\' 3"  (1.905 m), weight 182 lb 12.8 oz (82.918 kg).  Oropharynx is without thrush or ulceration. Lungs are clear. Regular cardiac rhythm. Port-A-Cath site is without erythema. Abdomen is soft. Liver is palpable in the right upper abdomen medially. Extremities are without edema.  Lab Results: Lab Results  Component Value Date   WBC 3.9* 06/01/2012   HGB 9.4* 06/01/2012   HCT 30.1* 06/01/2012   MCV 75.7* 06/01/2012   PLT 435* 06/01/2012    Chemistry:    Chemistry      Component Value Date/Time   NA 135* 06/01/2012 1203   K 4.4 06/01/2012 1203   CL 103 06/01/2012 1203   CO2 24 06/01/2012 1203   BUN 20.0 06/01/2012 1203   CREATININE 1.6* 06/01/2012 1203      Component Value Date/Time   CALCIUM 8.8 06/01/2012 1203   ALKPHOS 130 06/01/2012 1203   AST 33 06/01/2012 1203   ALT 22 06/01/2012 1203   BILITOT 0.38 06/01/2012 1203       Studies/Results: No results found.  Medications: I have reviewed the patient's current medications.  Assessment/Plan:  1. Metastatic colon cancer diagnosed in January 2013, right colon mass (T3 N1), status post right colectomy and biopsy of a metastatic liver lesion. Status post 11 cycles of FOLFOX/Avastin with restaging CT on 01/15/2012 revealing stable metastatic disease involving the liver. Chemotherapy switched to weekly 5-FU/leucovorin and every 2 week Avastin beginning on 02/18/2012; last treated with 5-FU/leucovorin on 03/24/2012 and last treated with Avastin on 03/31/2012. Restaging CT evaluation  04/23/2012 showed increased hepatic metastatic disease. Cycle 1 of FOLFIRI chemotherapy 05/04/2012; cycle 2 05/26/2012. 2. Microcytic anemia. Question iron deficiency. Ferritin was normal on 04/20/2012. Stable. 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 5. Delayed nausea following cycle 2 FOLFIRI. He will receive Aloxi beginning with cycle 3.  Disposition-Mr. Neal appears stable. He has completed 2 cycles of FOLFIRI. He will return for cycle 3 on 06/08/2012 with adjustments to the premedication regimen as noted above. We will see him in followup prior to treatment on 06/22/2012 (cycle 4) and will obtain a repeat CEA at that time. He will contact the office in the interim with any problems.  Plan reviewed with Dr. Truett Perna.  Lonna Cobb ANP/GNP-BC

## 2012-06-01 NOTE — Telephone Encounter (Signed)
gv and printed appt schedule for pt....emailed michelle to add tx..the patient aware °

## 2012-06-01 NOTE — Progress Notes (Signed)
I called pharmacy and spoke with Nathan Robbins. I advised him that Nathan Robbins has only $30.40 left for his meds. We are using $200.00 of his remaining 400.00 to pay his rent. I also advised Nathan Robbins on today of what his balance would be and that I was leaving him the $30.40 for the meds.

## 2012-06-01 NOTE — Progress Notes (Signed)
Patient did not show up for nutrition appointment. 

## 2012-06-01 NOTE — Telephone Encounter (Signed)
Per staff message and POF I have scheduled appts.  JMW  

## 2012-06-08 ENCOUNTER — Ambulatory Visit (HOSPITAL_BASED_OUTPATIENT_CLINIC_OR_DEPARTMENT_OTHER): Payer: Medicaid Other

## 2012-06-08 ENCOUNTER — Other Ambulatory Visit: Payer: Self-pay | Admitting: Oncology

## 2012-06-08 ENCOUNTER — Other Ambulatory Visit (HOSPITAL_BASED_OUTPATIENT_CLINIC_OR_DEPARTMENT_OTHER): Payer: Medicaid - Out of State | Admitting: Lab

## 2012-06-08 ENCOUNTER — Telehealth: Payer: Self-pay | Admitting: Oncology

## 2012-06-08 ENCOUNTER — Ambulatory Visit: Payer: Medicaid - Out of State | Admitting: Nutrition

## 2012-06-08 VITALS — BP 158/88 | HR 62 | Temp 97.3°F

## 2012-06-08 DIAGNOSIS — Z5111 Encounter for antineoplastic chemotherapy: Secondary | ICD-10-CM

## 2012-06-08 DIAGNOSIS — C189 Malignant neoplasm of colon, unspecified: Secondary | ICD-10-CM

## 2012-06-08 DIAGNOSIS — C787 Secondary malignant neoplasm of liver and intrahepatic bile duct: Secondary | ICD-10-CM

## 2012-06-08 DIAGNOSIS — C182 Malignant neoplasm of ascending colon: Secondary | ICD-10-CM

## 2012-06-08 LAB — CBC WITH DIFFERENTIAL/PLATELET
BASO%: 0.9 % (ref 0.0–2.0)
EOS%: 4.5 % (ref 0.0–7.0)
LYMPH%: 34.5 % (ref 14.0–49.0)
MCH: 23.9 pg — ABNORMAL LOW (ref 27.2–33.4)
MCHC: 31.6 g/dL — ABNORMAL LOW (ref 32.0–36.0)
MCV: 75.4 fL — ABNORMAL LOW (ref 79.3–98.0)
MONO%: 12.7 % (ref 0.0–14.0)
NEUT#: 2 10*3/uL (ref 1.5–6.5)
Platelets: 433 10*3/uL — ABNORMAL HIGH (ref 140–400)
RBC: 4.13 10*6/uL — ABNORMAL LOW (ref 4.20–5.82)
RDW: 21.2 % — ABNORMAL HIGH (ref 11.0–14.6)

## 2012-06-08 LAB — COMPREHENSIVE METABOLIC PANEL (CC13)
AST: 31 U/L (ref 5–34)
Albumin: 3.4 g/dL — ABNORMAL LOW (ref 3.5–5.0)
Alkaline Phosphatase: 151 U/L — ABNORMAL HIGH (ref 40–150)
Potassium: 4.4 mEq/L (ref 3.5–5.1)
Sodium: 134 mEq/L — ABNORMAL LOW (ref 136–145)
Total Bilirubin: 0.26 mg/dL (ref 0.20–1.20)
Total Protein: 8.5 g/dL — ABNORMAL HIGH (ref 6.4–8.3)

## 2012-06-08 MED ORDER — SODIUM CHLORIDE 0.9 % IV SOLN
2400.0000 mg/m2 | INTRAVENOUS | Status: DC
  Administered 2012-06-08: 5100 mg via INTRAVENOUS
  Filled 2012-06-08: qty 102

## 2012-06-08 MED ORDER — SODIUM CHLORIDE 0.9 % IV SOLN
Freq: Once | INTRAVENOUS | Status: AC
  Administered 2012-06-08: 12:00:00 via INTRAVENOUS

## 2012-06-08 MED ORDER — IRINOTECAN HCL CHEMO INJECTION 100 MG/5ML
178.0000 mg/m2 | Freq: Once | INTRAVENOUS | Status: AC
  Administered 2012-06-08: 380 mg via INTRAVENOUS
  Filled 2012-06-08: qty 19

## 2012-06-08 MED ORDER — LEUCOVORIN CALCIUM INJECTION 350 MG
399.0000 mg/m2 | Freq: Once | INTRAMUSCULAR | Status: AC
  Administered 2012-06-08: 850 mg via INTRAVENOUS
  Filled 2012-06-08: qty 42.5

## 2012-06-08 MED ORDER — DEXAMETHASONE SODIUM PHOSPHATE 4 MG/ML IJ SOLN
20.0000 mg | Freq: Once | INTRAMUSCULAR | Status: AC
  Administered 2012-06-08: 20 mg via INTRAVENOUS

## 2012-06-08 MED ORDER — ATROPINE SULFATE 1 MG/ML IJ SOLN
0.5000 mg | Freq: Once | INTRAMUSCULAR | Status: AC | PRN
  Administered 2012-06-08: 0.5 mg via INTRAVENOUS

## 2012-06-08 MED ORDER — FLUOROURACIL CHEMO INJECTION 2.5 GM/50ML
400.0000 mg/m2 | Freq: Once | INTRAVENOUS | Status: AC
  Administered 2012-06-08: 850 mg via INTRAVENOUS
  Filled 2012-06-08: qty 17

## 2012-06-08 MED ORDER — PALONOSETRON HCL INJECTION 0.25 MG/5ML
0.2500 mg | Freq: Once | INTRAVENOUS | Status: AC
  Administered 2012-06-08: 0.25 mg via INTRAVENOUS

## 2012-06-08 NOTE — Telephone Encounter (Signed)
Pt called ,left message called pt back and left message regarding appt for 06/08/12 lab, and chemo

## 2012-06-08 NOTE — Patient Instructions (Addendum)
[facility] Discharge Instructions for Patients Receiving Chemotherapy  Today you received the following chemotherapy agents : 5FU, Leucovorin, Irinotecan  To help prevent nausea and vomiting after your treatment, we encourage you to take your nausea medication as directed by your MD. If you develop nausea and vomiting that is not controlled by your nausea medication, call the clinic. If it is after clinic hours your family physician or the after hours number for the clinic or go to the Emergency Department.   BELOW ARE SYMPTOMS THAT SHOULD BE REPORTED IMMEDIATELY:  *FEVER GREATER THAN 100.5 F  *CHILLS WITH OR WITHOUT FEVER  NAUSEA AND VOMITING THAT IS NOT CONTROLLED WITH YOUR NAUSEA MEDICATION  *UNUSUAL SHORTNESS OF BREATH  *UNUSUAL BRUISING OR BLEEDING  TENDERNESS IN MOUTH AND THROAT WITH OR WITHOUT PRESENCE OF ULCERS  *URINARY PROBLEMS  *BOWEL PROBLEMS  UNUSUAL RASH Items with * indicate a potential emergency and should be followed up as soon as possible.  Feel free to call the clinic you have any questions or concerns. The clinic phone number is [phone].

## 2012-06-08 NOTE — Progress Notes (Signed)
Patient's weight has declined to 182.8 pounds January 14 from 188.3 pounds December 9. Patient continues to struggle with finances. He is working with our Child psychotherapist. Patient prefers to consume fresh fruits and vegetables however at this time he cannot afford them. He will drink Ensure Plus but is unable to afford this as well.  Nutrition diagnosis: Food and nutrition related knowledge deficit continues.  Intervention: I have educated patient on ways to consume healthy diet using healthy foods that are less expensive. I will provide a second case of Ensure Plus for patient to take with him on Thursday when he comes for a pump change. (The Ensure Plus is on order.)  Teach back method used.  Monitoring, evaluation, goals: Patient is tolerating oral diet but has had continued weight loss.   Next visit: Patient will contact me for questions or concerns.

## 2012-06-10 ENCOUNTER — Ambulatory Visit (HOSPITAL_BASED_OUTPATIENT_CLINIC_OR_DEPARTMENT_OTHER): Payer: Medicaid - Out of State

## 2012-06-10 ENCOUNTER — Other Ambulatory Visit: Payer: Self-pay | Admitting: *Deleted

## 2012-06-10 ENCOUNTER — Encounter: Payer: Self-pay | Admitting: *Deleted

## 2012-06-10 VITALS — BP 164/80 | HR 56 | Temp 97.6°F

## 2012-06-10 DIAGNOSIS — C182 Malignant neoplasm of ascending colon: Secondary | ICD-10-CM

## 2012-06-10 DIAGNOSIS — C189 Malignant neoplasm of colon, unspecified: Secondary | ICD-10-CM

## 2012-06-10 MED ORDER — SODIUM CHLORIDE 0.9 % IJ SOLN
10.0000 mL | INTRAMUSCULAR | Status: DC | PRN
  Administered 2012-06-10: 10 mL
  Filled 2012-06-10: qty 10

## 2012-06-10 MED ORDER — HEPARIN SOD (PORK) LOCK FLUSH 100 UNIT/ML IV SOLN
500.0000 [IU] | Freq: Once | INTRAVENOUS | Status: AC | PRN
  Administered 2012-06-10: 500 [IU]
  Filled 2012-06-10: qty 5

## 2012-06-10 NOTE — Telephone Encounter (Signed)
VERBAL ORDER AND READ BACK TO DR.SHERRILL- WOULD PREFER PT. TO OBTAIN THIS MEDICATION FROM PRIMARY CARE PHYSICIAN, DR.ED GREEN. IF PT. HAS NOT ESTABLISHED WITH DR.GREEN MAY REFILL MEDICATION. STRESS WITH PT. IMPORTANCE OF HAVING A PRIMARY CARE PHYSICIAN. CALLED BRIAN. PT. HAS LEFT THE PHARMACY.

## 2012-06-10 NOTE — Patient Instructions (Signed)
Call MD for problems or questions 

## 2012-06-15 ENCOUNTER — Inpatient Hospital Stay: Payer: Medicaid - Out of State

## 2012-06-15 ENCOUNTER — Telehealth: Payer: Self-pay | Admitting: *Deleted

## 2012-06-15 ENCOUNTER — Ambulatory Visit (HOSPITAL_BASED_OUTPATIENT_CLINIC_OR_DEPARTMENT_OTHER): Payer: Medicaid - Out of State | Admitting: Oncology

## 2012-06-15 ENCOUNTER — Other Ambulatory Visit (HOSPITAL_BASED_OUTPATIENT_CLINIC_OR_DEPARTMENT_OTHER): Payer: Medicaid - Out of State | Admitting: Lab

## 2012-06-15 ENCOUNTER — Encounter: Payer: Medicaid - Out of State | Admitting: Nutrition

## 2012-06-15 ENCOUNTER — Telehealth: Payer: Self-pay | Admitting: Oncology

## 2012-06-15 VITALS — BP 155/91 | HR 75 | Temp 98.3°F | Resp 20 | Ht 75.0 in | Wt 188.8 lb

## 2012-06-15 DIAGNOSIS — G620 Drug-induced polyneuropathy: Secondary | ICD-10-CM

## 2012-06-15 DIAGNOSIS — C189 Malignant neoplasm of colon, unspecified: Secondary | ICD-10-CM

## 2012-06-15 DIAGNOSIS — C182 Malignant neoplasm of ascending colon: Secondary | ICD-10-CM

## 2012-06-15 DIAGNOSIS — D509 Iron deficiency anemia, unspecified: Secondary | ICD-10-CM

## 2012-06-15 DIAGNOSIS — C787 Secondary malignant neoplasm of liver and intrahepatic bile duct: Secondary | ICD-10-CM

## 2012-06-15 LAB — CBC WITH DIFFERENTIAL/PLATELET
Basophils Absolute: 0 10*3/uL (ref 0.0–0.1)
Eosinophils Absolute: 0.2 10*3/uL (ref 0.0–0.5)
HCT: 31.4 % — ABNORMAL LOW (ref 38.4–49.9)
HGB: 9.9 g/dL — ABNORMAL LOW (ref 13.0–17.1)
MCV: 75.3 fL — ABNORMAL LOW (ref 79.3–98.0)
NEUT#: 0.8 10*3/uL — ABNORMAL LOW (ref 1.5–6.5)
RDW: 21.5 % — ABNORMAL HIGH (ref 11.0–14.6)
lymph#: 1.3 10*3/uL (ref 0.9–3.3)

## 2012-06-15 LAB — COMPREHENSIVE METABOLIC PANEL (CC13)
Albumin: 3.2 g/dL — ABNORMAL LOW (ref 3.5–5.0)
CO2: 26 mEq/L (ref 22–29)
Calcium: 8.8 mg/dL (ref 8.4–10.4)
Glucose: 157 mg/dl — ABNORMAL HIGH (ref 70–99)
Potassium: 4 mEq/L (ref 3.5–5.1)
Sodium: 133 mEq/L — ABNORMAL LOW (ref 136–145)
Total Protein: 8.3 g/dL (ref 6.4–8.3)

## 2012-06-15 NOTE — Telephone Encounter (Signed)
Per staff message and POF I have scheduled appts.  JMW  

## 2012-06-15 NOTE — Telephone Encounter (Signed)
appts made and printed for pt pt aware that chemo will be added,email to mw

## 2012-06-15 NOTE — Progress Notes (Signed)
   [facility]    OFFICE PROGRESS NOTE   INTERVAL HISTORY:   He returns as scheduled. He feels well. No nausea, diarrhea, or mouth sores. Good appetite. He completed a third cycle of FOLFIRI on 06/08/2012. He now has a primary care physician.  Objective:  Vital signs in last 24 hours:  Blood pressure 155/91, pulse 75, temperature 98.3 F (36.8 C), temperature source Oral, resp. rate 20, height 6\' 3"  (1.905 m), weight 188 lb 12.8 oz (85.639 kg).    HEENT: No thrush or ulcers Resp: Lungs clear bilaterally Cardio: Regular rate and rhythm GI: No splenomegaly. The liver edge is palpable several fingers below the medial right costal margin Vascular: No leg edema  Portacath/PICC-without erythema  Lab Results:  Lab Results  Component Value Date   WBC 2.6* 06/15/2012   HGB 9.9* 06/15/2012   HCT 31.4* 06/15/2012   MCV 75.3* 06/15/2012   PLT 413* 06/15/2012   ANC 0.8    Medications: I have reviewed the patient's current medications.  Assessment/Plan: 1. Metastatic colon cancer diagnosed in January 2013, right colon mass (T3 N1), status post right colectomy and biopsy of a metastatic liver lesion. Status post 11 cycles of FOLFOX/Avastin with restaging CT on 01/15/2012 revealing stable metastatic disease involving the liver. Chemotherapy switched to weekly 5-FU/leucovorin and every 2 week Avastin beginning on 02/18/2012; last treated with 5-FU/leucovorin on 03/24/2012 and last treated with Avastin on 03/31/2012. Restaging CT evaluation 04/23/2012 showed increased hepatic metastatic disease. Cycle 1 of FOLFIRI chemotherapy 05/04/2012. 2. Microcytic anemia. Question iron deficiency. Ferritin was normal on 04/20/2012. Stable. 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 5. Delayed nausea following cycle 2 FOLFIRI. He will receive Aloxi beginning with cycle 3.  Disposition:  He appears stable. He has completed 3  cycles of FOLFIRI. He will return for cycle 4 on 06/22/2012. He is scheduled for an office visit and chemotherapy on 07/06/2012. We will followup on the CEA from today.   Thornton Papas, MD  06/15/2012  2:00 PM

## 2012-06-16 ENCOUNTER — Telehealth: Payer: Self-pay | Admitting: *Deleted

## 2012-06-16 NOTE — Telephone Encounter (Signed)
Message copied by Wandalee Ferdinand on Wed Jun 16, 2012  4:11 PM ------      Message from: Ladene Artist      Created: Tue Jun 15, 2012  9:55 PM       Check cea next lab

## 2012-06-16 NOTE — Telephone Encounter (Signed)
Patient notified of CEA level. Will recheck on 06/22/12. Told him to try not to be concerned with one time elevation. We are looking more for persistent trends and as long as he is feeling well, that is a good sign. He understands and agrees to plan.

## 2012-06-20 ENCOUNTER — Other Ambulatory Visit: Payer: Self-pay | Admitting: Oncology

## 2012-06-21 ENCOUNTER — Other Ambulatory Visit: Payer: Self-pay | Admitting: *Deleted

## 2012-06-22 ENCOUNTER — Telehealth: Payer: Self-pay | Admitting: *Deleted

## 2012-06-22 ENCOUNTER — Ambulatory Visit: Payer: Medicaid - Out of State | Admitting: Oncology

## 2012-06-22 ENCOUNTER — Other Ambulatory Visit: Payer: Medicaid - Out of State | Admitting: Lab

## 2012-06-22 ENCOUNTER — Telehealth: Payer: Self-pay | Admitting: Oncology

## 2012-06-22 ENCOUNTER — Other Ambulatory Visit (HOSPITAL_BASED_OUTPATIENT_CLINIC_OR_DEPARTMENT_OTHER): Payer: Medicaid - Out of State | Admitting: Lab

## 2012-06-22 ENCOUNTER — Ambulatory Visit (HOSPITAL_BASED_OUTPATIENT_CLINIC_OR_DEPARTMENT_OTHER): Payer: Medicaid - Out of State

## 2012-06-22 ENCOUNTER — Ambulatory Visit (HOSPITAL_BASED_OUTPATIENT_CLINIC_OR_DEPARTMENT_OTHER): Payer: Medicaid - Out of State | Admitting: Nurse Practitioner

## 2012-06-22 VITALS — BP 152/83 | HR 68 | Temp 97.8°F | Resp 20 | Ht 75.0 in | Wt 187.2 lb

## 2012-06-22 DIAGNOSIS — C182 Malignant neoplasm of ascending colon: Secondary | ICD-10-CM

## 2012-06-22 DIAGNOSIS — C189 Malignant neoplasm of colon, unspecified: Secondary | ICD-10-CM

## 2012-06-22 DIAGNOSIS — C787 Secondary malignant neoplasm of liver and intrahepatic bile duct: Secondary | ICD-10-CM

## 2012-06-22 DIAGNOSIS — G589 Mononeuropathy, unspecified: Secondary | ICD-10-CM

## 2012-06-22 DIAGNOSIS — D509 Iron deficiency anemia, unspecified: Secondary | ICD-10-CM

## 2012-06-22 DIAGNOSIS — Z5111 Encounter for antineoplastic chemotherapy: Secondary | ICD-10-CM

## 2012-06-22 LAB — CBC WITH DIFFERENTIAL/PLATELET
Basophils Absolute: 0.1 10*3/uL (ref 0.0–0.1)
EOS%: 5 % (ref 0.0–7.0)
Eosinophils Absolute: 0.3 10*3/uL (ref 0.0–0.5)
LYMPH%: 29.9 % (ref 14.0–49.0)
MCH: 24 pg — ABNORMAL LOW (ref 27.2–33.4)
MCV: 75.2 fL — ABNORMAL LOW (ref 79.3–98.0)
MONO%: 11.3 % (ref 0.0–14.0)
NEUT#: 3 10*3/uL (ref 1.5–6.5)
Platelets: 445 10*3/uL — ABNORMAL HIGH (ref 140–400)
RBC: 4.06 10*6/uL — ABNORMAL LOW (ref 4.20–5.82)

## 2012-06-22 LAB — COMPREHENSIVE METABOLIC PANEL (CC13)
CO2: 24 mEq/L (ref 22–29)
Creatinine: 1.6 mg/dL — ABNORMAL HIGH (ref 0.7–1.3)
Glucose: 143 mg/dl — ABNORMAL HIGH (ref 70–99)
Total Bilirubin: 0.25 mg/dL (ref 0.20–1.20)

## 2012-06-22 LAB — CEA: CEA: 136.3 ng/mL — ABNORMAL HIGH (ref 0.0–5.0)

## 2012-06-22 MED ORDER — SODIUM CHLORIDE 0.9 % IV SOLN
2400.0000 mg/m2 | INTRAVENOUS | Status: DC
  Administered 2012-06-22: 5100 mg via INTRAVENOUS
  Filled 2012-06-22: qty 102

## 2012-06-22 MED ORDER — PALONOSETRON HCL INJECTION 0.25 MG/5ML
0.2500 mg | Freq: Once | INTRAVENOUS | Status: AC
  Administered 2012-06-22: 0.25 mg via INTRAVENOUS

## 2012-06-22 MED ORDER — LEUCOVORIN CALCIUM INJECTION 350 MG
399.0000 mg/m2 | Freq: Once | INTRAVENOUS | Status: AC
  Administered 2012-06-22: 850 mg via INTRAVENOUS
  Filled 2012-06-22: qty 42.5

## 2012-06-22 MED ORDER — IRINOTECAN HCL CHEMO INJECTION 100 MG/5ML
180.0000 mg/m2 | Freq: Once | INTRAVENOUS | Status: AC
  Administered 2012-06-22: 384 mg via INTRAVENOUS
  Filled 2012-06-22: qty 19.2

## 2012-06-22 MED ORDER — DEXAMETHASONE SODIUM PHOSPHATE 4 MG/ML IJ SOLN
20.0000 mg | Freq: Once | INTRAMUSCULAR | Status: AC
  Administered 2012-06-22: 20 mg via INTRAVENOUS

## 2012-06-22 MED ORDER — FLUOROURACIL CHEMO INJECTION 2.5 GM/50ML
400.0000 mg/m2 | Freq: Once | INTRAVENOUS | Status: AC
  Administered 2012-06-22: 850 mg via INTRAVENOUS
  Filled 2012-06-22: qty 17

## 2012-06-22 MED ORDER — SODIUM CHLORIDE 0.9 % IV SOLN
Freq: Once | INTRAVENOUS | Status: AC
  Administered 2012-06-22: 11:00:00 via INTRAVENOUS

## 2012-06-22 MED ORDER — ATROPINE SULFATE 1 MG/ML IJ SOLN: 0.5000 mg | Freq: Once | INTRAMUSCULAR | Status: DC | PRN

## 2012-06-22 NOTE — Progress Notes (Signed)
OFFICE PROGRESS NOTE  Interval history:  Nathan Robbins returns as scheduled. He completed cycle 3 of FOLFIRI on 06/08/2012. He denies nausea/vomiting. No mouth sores. No diarrhea. He continues to have a good appetite. He denies shortness of breath.   Objective: Blood pressure 152/83, pulse 68, temperature 97.8 F (36.6 C), temperature source Oral, resp. rate 20, height 6\' 3"  (1.905 m), weight 187 lb 3.2 oz (84.913 kg).  Oropharynx is without thrush or ulceration. Lungs are clear. Regular cardiac rhythm. Port-A-Cath site is without erythema. Abdomen is soft. Liver is palpable at the medial right upper quadrant. Extremities are without edema.  Lab Results: Lab Results  Component Value Date   WBC 5.6 06/22/2012   HGB 9.7* 06/22/2012   HCT 30.5* 06/22/2012   MCV 75.2* 06/22/2012   PLT 445* 06/22/2012    Chemistry:    Chemistry      Component Value Date/Time   NA 133* 06/15/2012 1200   K 4.0 06/15/2012 1200   CL 101 06/15/2012 1200   CO2 26 06/15/2012 1200   BUN 14.5 06/15/2012 1200   CREATININE 1.5* 06/15/2012 1200      Component Value Date/Time   CALCIUM 8.8 06/15/2012 1200   ALKPHOS 137 06/15/2012 1200   AST 37* 06/15/2012 1200   ALT 25 06/15/2012 1200   BILITOT 0.28 06/15/2012 1200       Studies/Results: No results found.  Medications: I have reviewed the patient's current medications.  Assessment/Plan:  1. Metastatic colon cancer diagnosed in January 2013, right colon mass (T3 N1), status post right colectomy and biopsy of a metastatic liver lesion. Status post 11 cycles of FOLFOX/Avastin with restaging CT on 01/15/2012 revealing stable metastatic disease involving the liver. Chemotherapy switched to weekly 5-FU/leucovorin and every 2 week Avastin beginning on 02/18/2012; last treated with 5-FU/leucovorin on 03/24/2012 and last treated with Avastin on 03/31/2012. Restaging CT evaluation 04/23/2012 showed increased hepatic metastatic disease. Cycle 1 of FOLFIRI chemotherapy 05/04/2012; cycle  2 05/26/2012; cycle 3 06/08/2012. 2. Microcytic anemia. Question iron deficiency. Ferritin was normal on 04/20/2012. Stable. 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 5. Delayed nausea following cycle 2 FOLFIRI. Aloxi added beginning with cycle 3. 6. CEA 56.5 on 04/20/2012; 159 on 06/15/2012.  Disposition-Nathan Robbins appears stable. Plan to proceed with cycle 4 FOLFIRI today as scheduled. He will return for a followup visit and cycle 5 in 2 weeks. He will contact the office in the interim with any problems.  Plan reviewed with Dr. Truett Perna.  Lonna Cobb ANP/GNP-BC

## 2012-06-22 NOTE — Telephone Encounter (Signed)
Per staff message and POF I have scheduled appts.  JMW  

## 2012-06-22 NOTE — Patient Instructions (Addendum)
[facility] Discharge Instructions for Patients Receiving Chemotherapy  Today you received the following chemotherapy agents :  Camptosar,  5FU,  Leucovorin.  To help prevent nausea and vomiting after your treatment, we encourage you to take your nausea medication as instructed by your physician.     If you develop nausea and vomiting that is not controlled by your nausea medication, call the clinic. If it is after clinic hours your family physician or the after hours number for the clinic or go to the Emergency Department.   BELOW ARE SYMPTOMS THAT SHOULD BE REPORTED IMMEDIATELY:  *FEVER GREATER THAN 100.5 F  *CHILLS WITH OR WITHOUT FEVER  NAUSEA AND VOMITING THAT IS NOT CONTROLLED WITH YOUR NAUSEA MEDICATION  *UNUSUAL SHORTNESS OF BREATH  *UNUSUAL BRUISING OR BLEEDING  TENDERNESS IN MOUTH AND THROAT WITH OR WITHOUT PRESENCE OF ULCERS  *URINARY PROBLEMS  *BOWEL PROBLEMS  UNUSUAL RASH Items with * indicate a potential emergency and should be followed up as soon as possible.  One of the nurses will contact you 24 hours after your treatment. Please let the nurse know about any problems that you may have experienced. Feel free to call the clinic you have any questions or concerns. The clinic phone number is [phone].   I have been informed and understand all the instructions given to me. I know to contact the clinic, my physician, or go to the Emergency Department if any problems should occur. I do not have any questions at this time, but understand that I may call the clinic during office hours   should I have any questions or need assistance in obtaining follow up care.    __________________________________________  _____________  __________ Signature of Patient or Authorized Representative            Date                   Time    __________________________________________ Nurse's Signature

## 2012-06-22 NOTE — Telephone Encounter (Signed)
gv and printed pt appt schedule for Feb and March...emailed michlle to add tx....pt aware

## 2012-06-24 ENCOUNTER — Ambulatory Visit (HOSPITAL_BASED_OUTPATIENT_CLINIC_OR_DEPARTMENT_OTHER): Payer: Medicaid - Out of State

## 2012-06-24 VITALS — BP 149/85 | HR 66 | Temp 97.5°F

## 2012-06-24 DIAGNOSIS — C189 Malignant neoplasm of colon, unspecified: Secondary | ICD-10-CM

## 2012-06-24 DIAGNOSIS — C182 Malignant neoplasm of ascending colon: Secondary | ICD-10-CM

## 2012-06-24 DIAGNOSIS — C787 Secondary malignant neoplasm of liver and intrahepatic bile duct: Secondary | ICD-10-CM

## 2012-06-24 DIAGNOSIS — Z5111 Encounter for antineoplastic chemotherapy: Secondary | ICD-10-CM

## 2012-06-24 MED ORDER — SODIUM CHLORIDE 0.9 % IJ SOLN
10.0000 mL | INTRAMUSCULAR | Status: DC | PRN
  Administered 2012-06-24: 10 mL
  Filled 2012-06-24: qty 10

## 2012-06-24 MED ORDER — HEPARIN SOD (PORK) LOCK FLUSH 100 UNIT/ML IV SOLN
500.0000 [IU] | Freq: Once | INTRAVENOUS | Status: AC | PRN
  Administered 2012-06-24: 500 [IU]
  Filled 2012-06-24: qty 5

## 2012-07-04 ENCOUNTER — Other Ambulatory Visit: Payer: Self-pay | Admitting: Oncology

## 2012-07-06 ENCOUNTER — Ambulatory Visit (HOSPITAL_BASED_OUTPATIENT_CLINIC_OR_DEPARTMENT_OTHER): Payer: Medicaid - Out of State

## 2012-07-06 ENCOUNTER — Other Ambulatory Visit (HOSPITAL_BASED_OUTPATIENT_CLINIC_OR_DEPARTMENT_OTHER): Payer: Medicaid Other

## 2012-07-06 ENCOUNTER — Ambulatory Visit (HOSPITAL_BASED_OUTPATIENT_CLINIC_OR_DEPARTMENT_OTHER): Payer: Medicaid - Out of State | Admitting: Nurse Practitioner

## 2012-07-06 ENCOUNTER — Telehealth: Payer: Self-pay | Admitting: Oncology

## 2012-07-06 VITALS — BP 138/73 | HR 69 | Temp 97.0°F | Resp 20 | Ht 75.0 in | Wt 184.2 lb

## 2012-07-06 DIAGNOSIS — C189 Malignant neoplasm of colon, unspecified: Secondary | ICD-10-CM

## 2012-07-06 DIAGNOSIS — D509 Iron deficiency anemia, unspecified: Secondary | ICD-10-CM

## 2012-07-06 DIAGNOSIS — C182 Malignant neoplasm of ascending colon: Secondary | ICD-10-CM

## 2012-07-06 DIAGNOSIS — C787 Secondary malignant neoplasm of liver and intrahepatic bile duct: Secondary | ICD-10-CM

## 2012-07-06 DIAGNOSIS — Z5111 Encounter for antineoplastic chemotherapy: Secondary | ICD-10-CM

## 2012-07-06 DIAGNOSIS — G620 Drug-induced polyneuropathy: Secondary | ICD-10-CM

## 2012-07-06 LAB — COMPREHENSIVE METABOLIC PANEL (CC13)
ALT: 22 U/L (ref 0–55)
CO2: 26 mEq/L (ref 22–29)
Calcium: 9.7 mg/dL (ref 8.4–10.4)
Chloride: 101 mEq/L (ref 98–107)
Sodium: 137 mEq/L (ref 136–145)
Total Bilirubin: 0.35 mg/dL (ref 0.20–1.20)
Total Protein: 8.7 g/dL — ABNORMAL HIGH (ref 6.4–8.3)

## 2012-07-06 LAB — CEA: CEA: 141.1 ng/mL — ABNORMAL HIGH (ref 0.0–5.0)

## 2012-07-06 MED ORDER — SODIUM CHLORIDE 0.9 % IV SOLN
Freq: Once | INTRAVENOUS | Status: AC
  Administered 2012-07-06: 100 mL via INTRAVENOUS

## 2012-07-06 MED ORDER — DEXAMETHASONE SODIUM PHOSPHATE 4 MG/ML IJ SOLN
20.0000 mg | Freq: Once | INTRAMUSCULAR | Status: AC
  Administered 2012-07-06: 20 mg via INTRAVENOUS

## 2012-07-06 MED ORDER — LEUCOVORIN CALCIUM INJECTION 350 MG
399.0000 mg/m2 | Freq: Once | INTRAVENOUS | Status: AC
  Administered 2012-07-06: 850 mg via INTRAVENOUS
  Filled 2012-07-06: qty 42.5

## 2012-07-06 MED ORDER — SODIUM CHLORIDE 0.9 % IV SOLN
2400.0000 mg/m2 | INTRAVENOUS | Status: DC
  Administered 2012-07-06: 5100 mg via INTRAVENOUS
  Filled 2012-07-06: qty 102

## 2012-07-06 MED ORDER — FLUOROURACIL CHEMO INJECTION 2.5 GM/50ML
400.0000 mg/m2 | Freq: Once | INTRAVENOUS | Status: AC
  Administered 2012-07-06: 850 mg via INTRAVENOUS
  Filled 2012-07-06: qty 17

## 2012-07-06 MED ORDER — IRINOTECAN HCL CHEMO INJECTION 100 MG/5ML
178.0000 mg/m2 | Freq: Once | INTRAVENOUS | Status: AC
  Administered 2012-07-06: 380 mg via INTRAVENOUS
  Filled 2012-07-06: qty 19

## 2012-07-06 MED ORDER — PALONOSETRON HCL INJECTION 0.25 MG/5ML
0.2500 mg | Freq: Once | INTRAVENOUS | Status: AC
  Administered 2012-07-06: 0.25 mg via INTRAVENOUS

## 2012-07-06 NOTE — Progress Notes (Signed)
OFFICE PROGRESS NOTE  Interval history:  Mr. Stangl returns as scheduled. He completed cycle 4 FOLFIRI on 06/22/2012. He denies nausea/vomiting. No diarrhea. He developed a single sore along the left side of the tongue. He used baking soda. The sore has resolved. He has a good appetite. He denies pain. Neuropathy symptoms are better.   Objective: Blood pressure 138/73, pulse 69, temperature 97 F (36.1 C), temperature source Oral, resp. rate 20, height 6\' 3"  (1.905 m), weight 184 lb 3.2 oz (83.553 kg).  Oropharynx is without thrush or ulceration. Lungs are clear. Regular cardiac rhythm. Port-A-Cath site is without erythema. Abdomen is soft and nontender. Liver palpable at the medial right upper quadrant. Extremities are without edema.   Lab Results: Lab Results  Component Value Date   WBC 5.6 06/22/2012   HGB 9.7* 06/22/2012   HCT 30.5* 06/22/2012   MCV 75.2* 06/22/2012   PLT 445* 06/22/2012    Chemistry:    Chemistry      Component Value Date/Time   NA 135* 06/22/2012 0931   K 4.3 06/22/2012 0931   CL 100 06/22/2012 0931   CO2 24 06/22/2012 0931   BUN 11.4 06/22/2012 0931   CREATININE 1.6* 06/22/2012 0931      Component Value Date/Time   CALCIUM 9.2 06/22/2012 0931   ALKPHOS 161* 06/22/2012 0931   AST 30 06/22/2012 0931   ALT 22 06/22/2012 0931   BILITOT 0.25 06/22/2012 0931       Studies/Results: No results found.  Medications: I have reviewed the patient's current medications.  Assessment/Plan:  1. Metastatic colon cancer diagnosed in January 2013, right colon mass (T3 N1), status post right colectomy and biopsy of a metastatic liver lesion. Status post 11 cycles of FOLFOX/Avastin with restaging CT on 01/15/2012 revealing stable metastatic disease involving the liver. Chemotherapy switched to weekly 5-FU/leucovorin and every 2 week Avastin beginning on 02/18/2012; last treated with 5-FU/leucovorin on 03/24/2012 and last treated with Avastin on 03/31/2012. Restaging CT evaluation 04/23/2012  showed increased hepatic metastatic disease. Cycle 1 of FOLFIRI chemotherapy 05/04/2012; cycle 2 05/26/2012; cycle 3 06/08/2012; cycle 4 06/22/2012. 2. Microcytic anemia. Question iron deficiency. Ferritin was normal on 04/20/2012. Stable. 3. Oxaliplatin neuropathy. Improved. 4. Positive K-ras mutation on bloc labeled [id number] from [place], [place]. 5. Delayed nausea following cycle 2 FOLFIRI. Aloxi added beginning with cycle 3. 6. CEA 56.5 on 04/20/2012; 159 on 06/15/2012; 136 on 06/22/2012.   Disposition-Mr. Harland appears stable. He has completed 4 cycles of FOLFIRI chemotherapy. Plan to proceed with cycle 5 today as scheduled. We will followup on the CEA from today. He will return for a followup visit and cycle 6 in 2 weeks. He will contact the office in the interim with any problems.  Plan reviewed with Dr. Truett Perna.  Lonna Cobb ANP/GNP-BC

## 2012-07-06 NOTE — Patient Instructions (Addendum)
[facility] Discharge Instructions for Patients Receiving Chemotherapy  Today you received the following chemotherapy agents CPT-11, Leucovorin, 61fu.   To help prevent nausea and vomiting after your treatment, we encourage you to take your nausea medication as per Dr. Truett Perna.  If you develop nausea and vomiting that is not controlled by your nausea medication, call the clinic. If it is after clinic hours your family physician or the after hours number for the clinic or go to the Emergency Department.   BELOW ARE SYMPTOMS THAT SHOULD BE REPORTED IMMEDIATELY:  *FEVER GREATER THAN 100.5 F  *CHILLS WITH OR WITHOUT FEVER  NAUSEA AND VOMITING THAT IS NOT CONTROLLED WITH YOUR NAUSEA MEDICATION  *UNUSUAL SHORTNESS OF BREATH  *UNUSUAL BRUISING OR BLEEDING  TENDERNESS IN MOUTH AND THROAT WITH OR WITHOUT PRESENCE OF ULCERS  *URINARY PROBLEMS  *BOWEL PROBLEMS  UNUSUAL RASH Items with * indicate a potential emergency and should be followed up as soon as possible.  . Feel free to call the clinic you have any questions or concerns. The clinic phone number is [phone].   I have been informed and understand all the instructions given to me. I know to contact the clinic, my physician, or go to the Emergency Department if any problems should occur. I do not have any questions at this time, but understand that I may call the clinic during office hours   should I have any questions or need assistance in obtaining follow up care.    __________________________________________  _____________  __________ Signature of Patient or Authorized Representative            Date                   Time    __________________________________________ Nurse's Signature

## 2012-07-06 NOTE — Telephone Encounter (Signed)
Gave pt appt for lab and MD, Roque Lias regarding chemo for 08/03/12

## 2012-07-06 NOTE — Progress Notes (Signed)
Lab not crossing over at this time.  Marsha from lab reported CBC result WBC 7.4, ANC 3.7 Plt 473. Hgb 9.6. Pt. Was seen by Lonna Cobb today. Okay to treat. HL

## 2012-07-08 ENCOUNTER — Ambulatory Visit (HOSPITAL_BASED_OUTPATIENT_CLINIC_OR_DEPARTMENT_OTHER): Payer: Medicaid Other

## 2012-07-08 ENCOUNTER — Telehealth: Payer: Self-pay | Admitting: Oncology

## 2012-07-08 ENCOUNTER — Telehealth: Payer: Self-pay | Admitting: *Deleted

## 2012-07-08 VITALS — BP 142/84 | HR 72 | Temp 98.3°F

## 2012-07-08 DIAGNOSIS — C182 Malignant neoplasm of ascending colon: Secondary | ICD-10-CM

## 2012-07-08 DIAGNOSIS — C189 Malignant neoplasm of colon, unspecified: Secondary | ICD-10-CM

## 2012-07-08 MED ORDER — SODIUM CHLORIDE 0.9 % IJ SOLN
10.0000 mL | INTRAMUSCULAR | Status: DC | PRN
  Administered 2012-07-08: 10 mL
  Filled 2012-07-08: qty 10

## 2012-07-08 MED ORDER — HEPARIN SOD (PORK) LOCK FLUSH 100 UNIT/ML IV SOLN
500.0000 [IU] | Freq: Once | INTRAVENOUS | Status: AC | PRN
  Administered 2012-07-08: 500 [IU]
  Filled 2012-07-08: qty 5

## 2012-07-08 NOTE — Telephone Encounter (Signed)
Called pt and left message reagrding appt for March 2014 lab, MD and chemo

## 2012-07-08 NOTE — Telephone Encounter (Signed)
Per staff message and POF I have scheduled appts.  JMW  

## 2012-07-17 ENCOUNTER — Other Ambulatory Visit: Payer: Self-pay | Admitting: Oncology

## 2012-07-20 ENCOUNTER — Telehealth: Payer: Self-pay | Admitting: *Deleted

## 2012-07-20 ENCOUNTER — Encounter: Payer: Self-pay | Admitting: *Deleted

## 2012-07-20 ENCOUNTER — Other Ambulatory Visit (HOSPITAL_BASED_OUTPATIENT_CLINIC_OR_DEPARTMENT_OTHER): Payer: Medicaid Other | Admitting: Lab

## 2012-07-20 ENCOUNTER — Ambulatory Visit (HOSPITAL_BASED_OUTPATIENT_CLINIC_OR_DEPARTMENT_OTHER): Payer: Medicaid Other | Admitting: Oncology

## 2012-07-20 ENCOUNTER — Inpatient Hospital Stay: Payer: Medicaid - Out of State

## 2012-07-20 VITALS — BP 158/83 | HR 64 | Temp 97.5°F | Resp 18 | Ht 75.0 in | Wt 185.2 lb

## 2012-07-20 DIAGNOSIS — C189 Malignant neoplasm of colon, unspecified: Secondary | ICD-10-CM

## 2012-07-20 DIAGNOSIS — C787 Secondary malignant neoplasm of liver and intrahepatic bile duct: Secondary | ICD-10-CM

## 2012-07-20 DIAGNOSIS — N61 Mastitis without abscess: Secondary | ICD-10-CM

## 2012-07-20 DIAGNOSIS — D509 Iron deficiency anemia, unspecified: Secondary | ICD-10-CM

## 2012-07-20 DIAGNOSIS — C182 Malignant neoplasm of ascending colon: Secondary | ICD-10-CM

## 2012-07-20 LAB — CBC WITH DIFFERENTIAL/PLATELET
BASO%: 0.3 % (ref 0.0–2.0)
LYMPH%: 30.7 % (ref 14.0–49.0)
MCHC: 30.5 g/dL — ABNORMAL LOW (ref 32.0–36.0)
MCV: 75.9 fL — ABNORMAL LOW (ref 79.3–98.0)
MONO%: 11.5 % (ref 0.0–14.0)
Platelets: 407 10*3/uL — ABNORMAL HIGH (ref 140–400)
RBC: 4.1 10*6/uL — ABNORMAL LOW (ref 4.20–5.82)
WBC: 7.2 10*3/uL (ref 4.0–10.3)

## 2012-07-20 LAB — TECHNOLOGIST REVIEW

## 2012-07-20 LAB — COMPREHENSIVE METABOLIC PANEL
ALT: 23 U/L (ref 0–53)
AST: 41 U/L — ABNORMAL HIGH (ref 0–37)
Alkaline Phosphatase: 177 U/L — ABNORMAL HIGH (ref 39–117)
Sodium: 135 mEq/L (ref 135–145)
Total Bilirubin: 0.2 mg/dL — ABNORMAL LOW (ref 0.3–1.2)
Total Protein: 8.6 g/dL — ABNORMAL HIGH (ref 6.0–8.3)

## 2012-07-20 NOTE — Progress Notes (Signed)
Has appointment at [facility] 07/21/12 at 1 pm for follow up of abscess that was drained on 07/15/12 by PCP. Currently on antibiotic and has dressing on with packing. No fever. Breast area still appears slightly swollen.

## 2012-07-20 NOTE — Progress Notes (Signed)
   [facility]    OFFICE PROGRESS NOTE   INTERVAL HISTORY:   Nathan Robbins returns as scheduled. Nathan Robbins completed another cycle of FOLFIRI on 07/06/2012. Nathan Robbins reports tolerating the chemotherapy well. No mouth sores or nausea. Minimal diarrhea. Nathan Robbins feels well. Nathan Robbins has noted continued improvement in the oxaliplatin neuropathy.  Nathan Robbins developed an area of swelling and erythema over the left breast approximately one week ago. Nathan Robbins saw Dr. Chilton Si and the area was incised and drained. There is a packing in place. Nathan Robbins is taking Augmentin. Nathan Robbins reports improvement in the left breast swelling and discomfort. Nathan Robbins is scheduled for an appointment in the wound clinic tomorrow.  Objective:  Vital signs in last 24 hours:  Blood pressure 158/83, pulse 64, temperature 97.5 F (36.4 C), temperature source Oral, resp. rate 18, height 6\' 3"  (1.905 m), weight 185 lb 3.2 oz (84.006 kg).    HEENT: No thrush or ulcers Resp: Lungs clear bilaterally Cardio: Regular rate and rhythm GI: No hepatomegaly, nontender Vascular: No leg edema  Skin: There is an approximate 1 cm opening at the inferior aspect of the left breast with a packing in place. No fluctuance or drainage. Minimal surrounding erythema and a tape distribution.   Portacath/PICC-without erythema  Lab Results:  Lab Results  Component Value Date   WBC 7.2 07/20/2012   HGB 9.5* 07/20/2012   HCT 31.1* 07/20/2012   MCV 75.9* 07/20/2012   PLT 407* 07/20/2012   ANC 3.8 CEA on 07/06/2012-141  Medications: I have reviewed the patient's current medications.  Assessment/Plan: 1. Metastatic colon cancer diagnosed in January 2013, right colon mass (T3 N1), status post right colectomy and biopsy of a metastatic liver lesion. Status post 11 cycles of FOLFOX/Avastin with restaging CT on 01/15/2012 revealing stable metastatic disease involving the liver. Chemotherapy switched to weekly 5-FU/leucovorin and every 2 week Avastin beginning on 02/18/2012; last treated with  5-FU/leucovorin on 03/24/2012 and last treated with Avastin on 03/31/2012. Restaging CT evaluation 04/23/2012 showed increased hepatic metastatic disease. Cycle 1 of FOLFIRI chemotherapy 05/04/2012; cycle 2 05/26/2012; cycle 3 06/08/2012; cycle 4 06/22/2012. Cycle 5 on 07/06/2012 2. Microcytic anemia. Question iron deficiency. Ferritin was normal on 04/20/2012. Stable. 3. Oxaliplatin neuropathy. Improved. 4. 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 5. Delayed nausea following cycle 2 FOLFIRI. Aloxi added beginning with cycle 3. 6. CEA 56.5 on 04/20/2012; 159 on 06/15/2012; 136 on 06/22/2012. 141 on 07/06/2012. 7. Skin infection over the left breast-status post a drainage procedure by Dr. Chilton Si, packing in place and on Augmentin.  Disposition:  Nathan Robbins has tolerated the FOLFIRI well to date. We decided to delay the next cycle of chemotherapy for one week secondary to the left breast infection. The infection appears to be resolving. Nathan Robbins will followup at the wound clinic. Nathan Robbins will contact us for a fever. Nathan Robbins is scheduled for cycle 6 of FOLFIRI on 07/27/2012. Nathan Robbins will return for an office visit on 08/10/2012. A restaging CT of the abdomen will be scheduled for 08/09/2012.   Nathan Papas, MD  07/20/2012  2:36 PM

## 2012-07-20 NOTE — Telephone Encounter (Signed)
Per staff message and POF I have scheduled appts.  JMW  

## 2012-07-21 ENCOUNTER — Encounter (HOSPITAL_BASED_OUTPATIENT_CLINIC_OR_DEPARTMENT_OTHER): Payer: Medicaid Other | Attending: General Surgery

## 2012-07-21 DIAGNOSIS — C189 Malignant neoplasm of colon, unspecified: Secondary | ICD-10-CM | POA: Insufficient documentation

## 2012-07-21 DIAGNOSIS — C787 Secondary malignant neoplasm of liver and intrahepatic bile duct: Secondary | ICD-10-CM | POA: Insufficient documentation

## 2012-07-21 DIAGNOSIS — N61 Mastitis without abscess: Secondary | ICD-10-CM | POA: Insufficient documentation

## 2012-07-22 NOTE — Progress Notes (Signed)
[facility]  NAME:  AKI, ABALOS NO.:  [id number]  MEDICAL RECORD NO.:  [id number]      DATE OF BIRTH:  [birth year]  PHYSICIAN:  Ardath Sax, M.D.           VISIT DATE:                                  OFFICE VISIT   Nathan Robbins is a 67 year old African American male who comes to [place] because of a draining abscess on the inferior aspect of his left areola. Apparently, there were some minor trauma, and I guess he might have developed a hematoma and there is now draining and is being treated as I and D of an abscess.  It is about half a cm long.  I do not feel any collection under here and we are just going to treat this with Iodoform dressing changes and have him wash it with soap and water.  This gentleman when he came here, he was found to be afebrile with a blood pressure of 150/80, respirations 18, pulse 80, he weighs 185 pounds, and the only medicine he is on is Augmentin, given to him for this abscess. He is also on promethazine, Taztia, and fluoxetine.  This man does have a very serious problem, in that he has colonic carcinoma.  He has had a recent resection and apparently he has metastatic disease to the liver. He has a Port-A-Cath and he is now getting chemotherapy through the [facility]- A-Cath IV every other week and this course is going to make it difficult for this wound to heal, but I think it is important for him to continue the chemotherapy at this point, and we will keep treating this by washing it and putting an iodoform, and I think eventually we will get it to heal.  DIAGNOSES: 1. Draining abscess area of the left areola. 2. History of metastatic colon cancer.     Ardath Sax, M.D.     PP/MEDQ  D:  07/21/2012  T:  07/22/2012  Job:  [id number]

## 2012-07-27 ENCOUNTER — Ambulatory Visit (HOSPITAL_BASED_OUTPATIENT_CLINIC_OR_DEPARTMENT_OTHER): Payer: Medicaid Other

## 2012-07-27 ENCOUNTER — Other Ambulatory Visit (HOSPITAL_BASED_OUTPATIENT_CLINIC_OR_DEPARTMENT_OTHER): Payer: Medicaid Other | Admitting: Lab

## 2012-07-27 VITALS — BP 117/93 | HR 74 | Temp 97.3°F

## 2012-07-27 DIAGNOSIS — C182 Malignant neoplasm of ascending colon: Secondary | ICD-10-CM

## 2012-07-27 DIAGNOSIS — C189 Malignant neoplasm of colon, unspecified: Secondary | ICD-10-CM

## 2012-07-27 LAB — BASIC METABOLIC PANEL (CC13)
CO2: 25 mEq/L (ref 22–29)
Glucose: 224 mg/dl — ABNORMAL HIGH (ref 70–99)
Potassium: 4.3 mEq/L (ref 3.5–5.1)
Sodium: 135 mEq/L — ABNORMAL LOW (ref 136–145)

## 2012-07-27 LAB — CBC WITH DIFFERENTIAL/PLATELET
BASO%: 0.6 % (ref 0.0–2.0)
Eosinophils Absolute: 0.2 10*3/uL (ref 0.0–0.5)
MONO#: 1.2 10*3/uL — ABNORMAL HIGH (ref 0.1–0.9)
NEUT#: 3.7 10*3/uL (ref 1.5–6.5)
RBC: 3.82 10*6/uL — ABNORMAL LOW (ref 4.20–5.82)
RDW: 23 % — ABNORMAL HIGH (ref 11.0–14.6)
WBC: 7 10*3/uL (ref 4.0–10.3)
lymph#: 1.9 10*3/uL (ref 0.9–3.3)

## 2012-07-27 MED ORDER — LEUCOVORIN CALCIUM INJECTION 350 MG
399.0000 mg/m2 | Freq: Once | INTRAVENOUS | Status: AC
  Administered 2012-07-27: 850 mg via INTRAVENOUS
  Filled 2012-07-27: qty 42.5

## 2012-07-27 MED ORDER — PALONOSETRON HCL INJECTION 0.25 MG/5ML
0.2500 mg | Freq: Once | INTRAVENOUS | Status: AC
  Administered 2012-07-27: 0.25 mg via INTRAVENOUS

## 2012-07-27 MED ORDER — DEXTROSE 5 % IV SOLN
178.0000 mg/m2 | Freq: Once | INTRAVENOUS | Status: AC
  Administered 2012-07-27: 380 mg via INTRAVENOUS
  Filled 2012-07-27: qty 19

## 2012-07-27 MED ORDER — SODIUM CHLORIDE 0.9 % IV SOLN
2400.0000 mg/m2 | INTRAVENOUS | Status: DC
  Administered 2012-07-27: 5100 mg via INTRAVENOUS
  Filled 2012-07-27: qty 102

## 2012-07-27 MED ORDER — PROCHLORPERAZINE EDISYLATE 5 MG/ML IJ SOLN
10.0000 mg | Freq: Once | INTRAMUSCULAR | Status: AC
  Administered 2012-07-27: 10 mg via INTRAVENOUS

## 2012-07-27 MED ORDER — DEXAMETHASONE SODIUM PHOSPHATE 4 MG/ML IJ SOLN
20.0000 mg | Freq: Once | INTRAMUSCULAR | Status: AC
  Administered 2012-07-27: 20 mg via INTRAVENOUS

## 2012-07-27 MED ORDER — FLUOROURACIL CHEMO INJECTION 2.5 GM/50ML
400.0000 mg/m2 | Freq: Once | INTRAVENOUS | Status: AC
  Administered 2012-07-27: 850 mg via INTRAVENOUS
  Filled 2012-07-27: qty 17

## 2012-07-27 NOTE — Patient Instructions (Signed)
[facility] Discharge Instructions for Patients Receiving Chemotherapy  Today you received the following chemotherapy agents: FOLFIRI. To help prevent nausea and vomiting after your treatment, we encourage you to take your nausea medication.    If you develop nausea and vomiting that is not controlled by your nausea medication, call the clinic.    BELOW ARE SYMPTOMS THAT SHOULD BE REPORTED IMMEDIATELY:  *FEVER GREATER THAN 100.5 F  *CHILLS WITH OR WITHOUT FEVER  NAUSEA AND VOMITING THAT IS NOT CONTROLLED WITH YOUR NAUSEA MEDICATION  *UNUSUAL SHORTNESS OF BREATH  *UNUSUAL BRUISING OR BLEEDING  TENDERNESS IN MOUTH AND THROAT WITH OR WITHOUT PRESENCE OF ULCERS  *URINARY PROBLEMS  *BOWEL PROBLEMS  UNUSUAL RASH Items with * indicate a potential emergency and should be followed up as soon as possible.  Feel free to call the clinic you have any questions or concerns. The clinic phone number is [phone].

## 2012-07-29 ENCOUNTER — Ambulatory Visit (HOSPITAL_BASED_OUTPATIENT_CLINIC_OR_DEPARTMENT_OTHER): Payer: Medicaid Other

## 2012-07-29 VITALS — BP 173/82 | HR 71 | Temp 98.0°F

## 2012-07-29 DIAGNOSIS — C189 Malignant neoplasm of colon, unspecified: Secondary | ICD-10-CM

## 2012-07-29 MED ORDER — HEPARIN SOD (PORK) LOCK FLUSH 100 UNIT/ML IV SOLN
500.0000 [IU] | Freq: Once | INTRAVENOUS | Status: AC | PRN
  Administered 2012-07-29: 500 [IU]
  Filled 2012-07-29: qty 5

## 2012-07-29 MED ORDER — SODIUM CHLORIDE 0.9 % IJ SOLN
10.0000 mL | INTRAMUSCULAR | Status: DC | PRN
  Administered 2012-07-29: 10 mL
  Filled 2012-07-29: qty 10

## 2012-07-29 NOTE — Patient Instructions (Signed)
Call MD for problems or concerns.  Patient to call radiology for instructions on upcoming CT scan with contrast

## 2012-07-31 ENCOUNTER — Other Ambulatory Visit: Payer: Self-pay | Admitting: Oncology

## 2012-08-03 ENCOUNTER — Other Ambulatory Visit: Payer: Medicaid - Out of State | Admitting: Lab

## 2012-08-03 ENCOUNTER — Ambulatory Visit (HOSPITAL_BASED_OUTPATIENT_CLINIC_OR_DEPARTMENT_OTHER): Payer: Medicaid Other | Admitting: Nurse Practitioner

## 2012-08-03 ENCOUNTER — Telehealth: Payer: Self-pay | Admitting: Oncology

## 2012-08-03 ENCOUNTER — Inpatient Hospital Stay: Payer: Medicaid - Out of State

## 2012-08-03 VITALS — BP 145/74 | HR 91 | Temp 97.3°F | Resp 20 | Ht 75.0 in | Wt 188.2 lb

## 2012-08-03 DIAGNOSIS — C189 Malignant neoplasm of colon, unspecified: Secondary | ICD-10-CM

## 2012-08-03 MED ORDER — OXYCODONE HCL 5 MG PO CAPS: 5.0000 mg | ORAL_CAPSULE | ORAL | Status: DC | PRN

## 2012-08-03 NOTE — Progress Notes (Signed)
OFFICE PROGRESS NOTE  Interval history:  Nathan Robbins returns as scheduled. He completed cycle 6 FOLFIRI on 07/27/2012. He had mild nausea. No vomiting. No diarrhea. He developed a single sore at the left lateral tongue. The sore is healing. The wound at the left breast is healing. He denies associated pain.   Objective: Blood pressure 145/74, pulse 91, temperature 97.3 F (36.3 C), temperature source Oral, resp. rate 20, height 6\' 3"  (1.905 m), weight 188 lb 3.2 oz (85.367 kg).  Small ulceration at the left lateral tongue. Lungs are clear. Regular cardiac rhythm. Port-A-Cath site is without erythema. Abdomen is soft. Liver is palpable at the medial right upper abdomen. Extremities without edema. Approximate half centimeter opening at the inferior aspect of the left breast. Bloody drainage expressed. Mild surrounding erythema.  Lab Results: Lab Results  Component Value Date   WBC 7.0 07/27/2012   HGB 9.0* 07/27/2012   HCT 28.2* 07/27/2012   MCV 73.6* 07/27/2012   PLT 398 07/27/2012    Chemistry:    Chemistry      Component Value Date/Time   NA 135* 07/27/2012 1020   NA 135 07/20/2012 1310   K 4.3 07/27/2012 1020   K 4.0 07/20/2012 1310   CL 101 07/27/2012 1020   CL 99 07/20/2012 1310   CO2 25 07/27/2012 1020   CO2 24 07/20/2012 1310   BUN 10.3 07/27/2012 1020   BUN 8 07/20/2012 1310   CREATININE 1.4* 07/27/2012 1020   CREATININE 1.21 07/20/2012 1310      Component Value Date/Time   CALCIUM 9.4 07/27/2012 1020   CALCIUM 8.9 07/20/2012 1310   ALKPHOS 177* 07/20/2012 1310   ALKPHOS 186* 07/06/2012 0944   AST 41* 07/20/2012 1310   AST 34 07/06/2012 0944   ALT 23 07/20/2012 1310   ALT 22 07/06/2012 0944   BILITOT 0.2* 07/20/2012 1310   BILITOT 0.35 07/06/2012 0944       Studies/Results: No results found.  Medications: I have reviewed the patient's current medications.  Assessment/Plan:  1. Metastatic colon cancer diagnosed in January 2013, right colon mass (T3 N1), status post right colectomy and  biopsy of a metastatic liver lesion. Status post 11 cycles of FOLFOX/Avastin with restaging CT on 01/15/2012 revealing stable metastatic disease involving the liver. Chemotherapy switched to weekly 5-FU/leucovorin and every 2 week Avastin beginning on 02/18/2012; last treated with 5-FU/leucovorin on 03/24/2012 and last treated with Avastin on 03/31/2012. Restaging CT evaluation 04/23/2012 showed increased hepatic metastatic disease. Cycle 1 of FOLFIRI chemotherapy 05/04/2012; cycle 2 05/26/2012; cycle 3 06/08/2012; cycle 4 06/22/2012. Cycle 5 on 07/06/2012; cycle 6 on 07/27/2012. 2. Microcytic anemia. Question iron deficiency. Ferritin was normal on 04/20/2012. Stable. 3. Oxaliplatin neuropathy. Improved. 4. 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 5. Delayed nausea following cycle 2 FOLFIRI. Aloxi added beginning with cycle 3. 6. CEA 56.5 on 04/20/2012; 159 on 06/15/2012; 136 on 06/22/2012. 141 on 07/06/2012. 7. Skin infection over the left breast-status post a drainage procedure by Dr. Chilton Si, packing in place. He continues Augmentin. The wound is healing.  Disposition-Nathan Robbins appears stable. He completed cycle 6 FOLFIRI on 07/27/2012. He is scheduled for restaging CT scans on 08/06/2012. He will return for a followup visit on 08/10/2012.    Lonna Cobb ANP/GNP-BC

## 2012-08-06 ENCOUNTER — Ambulatory Visit (HOSPITAL_COMMUNITY)
Admission: RE | Admit: 2012-08-06 | Discharge: 2012-08-06 | Disposition: A | Discharge status: Home / Self Care | Payer: Medicaid Other | Source: Ambulatory Visit | Attending: Oncology | Admitting: Oncology

## 2012-08-06 ENCOUNTER — Encounter (HOSPITAL_COMMUNITY): Payer: Self-pay

## 2012-08-06 DIAGNOSIS — J984 Other disorders of lung: Secondary | ICD-10-CM | POA: Insufficient documentation

## 2012-08-06 DIAGNOSIS — C189 Malignant neoplasm of colon, unspecified: Secondary | ICD-10-CM | POA: Insufficient documentation

## 2012-08-06 DIAGNOSIS — C787 Secondary malignant neoplasm of liver and intrahepatic bile duct: Secondary | ICD-10-CM | POA: Insufficient documentation

## 2012-08-06 HISTORY — DX: Malignant (primary) neoplasm, unspecified: C80.1

## 2012-08-06 HISTORY — DX: Essential (primary) hypertension: I10

## 2012-08-06 MED ORDER — IOHEXOL 300 MG/ML  SOLN
100.0000 mL | Freq: Once | INTRAMUSCULAR | Status: AC | PRN
  Administered 2012-08-06: 100 mL via INTRAVENOUS

## 2012-08-08 ENCOUNTER — Other Ambulatory Visit: Payer: Self-pay | Admitting: Oncology

## 2012-08-10 ENCOUNTER — Inpatient Hospital Stay: Payer: Medicaid Other

## 2012-08-10 ENCOUNTER — Other Ambulatory Visit (HOSPITAL_BASED_OUTPATIENT_CLINIC_OR_DEPARTMENT_OTHER): Payer: Medicaid Other | Admitting: Lab

## 2012-08-10 ENCOUNTER — Ambulatory Visit (HOSPITAL_BASED_OUTPATIENT_CLINIC_OR_DEPARTMENT_OTHER): Payer: Medicaid Other | Admitting: Nurse Practitioner

## 2012-08-10 VITALS — BP 141/80 | HR 72 | Temp 96.6°F | Resp 18 | Ht 75.0 in | Wt 187.6 lb

## 2012-08-10 DIAGNOSIS — C182 Malignant neoplasm of ascending colon: Secondary | ICD-10-CM

## 2012-08-10 DIAGNOSIS — C7800 Secondary malignant neoplasm of unspecified lung: Secondary | ICD-10-CM

## 2012-08-10 DIAGNOSIS — C189 Malignant neoplasm of colon, unspecified: Secondary | ICD-10-CM

## 2012-08-10 DIAGNOSIS — G620 Drug-induced polyneuropathy: Secondary | ICD-10-CM

## 2012-08-10 DIAGNOSIS — D509 Iron deficiency anemia, unspecified: Secondary | ICD-10-CM

## 2012-08-10 LAB — COMPREHENSIVE METABOLIC PANEL (CC13)
ALT: 26 U/L (ref 0–55)
BUN: 10.1 mg/dL (ref 7.0–26.0)
CO2: 25 mEq/L (ref 22–29)
Creatinine: 1.4 mg/dL — ABNORMAL HIGH (ref 0.7–1.3)
Glucose: 139 mg/dl — ABNORMAL HIGH (ref 70–99)
Total Bilirubin: 0.4 mg/dL (ref 0.20–1.20)

## 2012-08-10 LAB — CBC WITH DIFFERENTIAL/PLATELET
Eosinophils Absolute: 0.2 10*3/uL (ref 0.0–0.5)
LYMPH%: 34.5 % (ref 14.0–49.0)
MCV: 76.1 fL — ABNORMAL LOW (ref 79.3–98.0)
MONO%: 11.8 % (ref 0.0–14.0)
NEUT#: 3.6 10*3/uL (ref 1.5–6.5)
Platelets: 405 10*3/uL — ABNORMAL HIGH (ref 140–400)
RBC: 3.93 10*6/uL — ABNORMAL LOW (ref 4.20–5.82)

## 2012-08-10 NOTE — Progress Notes (Signed)
OFFICE PROGRESS NOTE  Interval history:  Mr. Hinkle returns as scheduled. He completed cycle 6 FOLFIRI on 07/27/2012. Restaging CT evaluation 08/06/2012 showed progression of hepatic metastases.  He feels well. He has a good appetite and energy level. He denies pain. No nausea or vomiting. No diarrhea.   Objective: Blood pressure 141/80, pulse 72, temperature 96.6 F (35.9 C), temperature source Oral, resp. rate 18, height 6\' 3"  (1.905 m), weight 187 lb 9.6 oz (85.095 kg).  Oropharynx is without thrush or ulceration. Lungs are clear. Regular cardiac rhythm. Port-A-Cath site is without erythema. The wound at the inferior aspect of the left breast has healed. Abdomen is soft. Liver palpable at the medial right upper abdomen crossing midline. Extremities are without edema.  Lab Results: Lab Results  Component Value Date   WBC 7.1 08/10/2012   HGB 9.1* 08/10/2012   HCT 29.9* 08/10/2012   MCV 76.1* 08/10/2012   PLT 405* 08/10/2012    Chemistry:    Chemistry      Component Value Date/Time   NA 135* 08/10/2012 1118   NA 135 07/20/2012 1310   K 3.9 08/10/2012 1118   K 4.0 07/20/2012 1310   CL 101 08/10/2012 1118   CL 99 07/20/2012 1310   CO2 25 08/10/2012 1118   CO2 24 07/20/2012 1310   BUN 10.1 08/10/2012 1118   BUN 8 07/20/2012 1310   CREATININE 1.4* 08/10/2012 1118   CREATININE 1.21 07/20/2012 1310      Component Value Date/Time   CALCIUM 9.1 08/10/2012 1118   CALCIUM 8.9 07/20/2012 1310   ALKPHOS 200* 08/10/2012 1118   ALKPHOS 177* 07/20/2012 1310   AST 49* 08/10/2012 1118   AST 41* 07/20/2012 1310   ALT 26 08/10/2012 1118   ALT 23 07/20/2012 1310   BILITOT 0.40 08/10/2012 1118   BILITOT 0.2* 07/20/2012 1310       Studies/Results: Ct Abdomen W Contrast  08/06/2012  *RADIOLOGY REPORT*  Clinical Data: Colon cancer diagnosed 2013 status post resection, chemotherapy ongoing, follow-up liver lesions  CT ABDOMEN WITH CONTRAST  Technique:  Multidetector CT imaging of the abdomen was performed following the  standard protocol during bolus administration of intravenous contrast.  Contrast: OMNIPAQUE IOHEXOL 300 MG/ML  SOLN  Comparison: CT chest abdomen pelvis dated 04/23/2012  Findings: Mild subpleural reticulation/dependent atelectasis at the lung bases.  Progression of multiple hepatic metastases.  Index lesions include: --2.7 x 2.9 cm lesion in the lateral segment left hepatic lobe (series 2/image 26), previously 2.4 x 2.1 cm --3.0 x 3.9 cm lesion in the anterior segment right hepatic lobe (series 2/image 30), previously 2.6 x 2.9 cm --4.7 x 9.2 cm lesion in the lateral segment left hepatic lobe (series 2/image 37), previously 4.3 x 6.9 cm  Spleen and adrenal glands are within normal limits.  Multiple parenchymal pancreatic calcifications with ductal ectasia of the pancreatic tail, likely sequela of prior/chronic pancreatitis.  Gallbladder is unremarkable.  No intrahepatic ductal dilatation. Common duct measures 10 mm (series 602/image 45), unchanged.  Right upper pole scarring.  Left kidney is grossly unremarkable. No hydronephrosis.  Visualized bowel is notable for a large colonic stool burden.  No abdominal ascites.  Small upper abdominal/retroperitoneal lymph nodes measuring up to 9 mm short axis (series 3/image 43).  Degenerative changes of the visualized thoracolumbar spine.  IMPRESSION: Progression of hepatic metastases, with index lesions as described above.  Small upper abdominal/retroperitoneal lymph nodes measuring up to 9 mm short axis.  Please note that the pelvis was  not imaged.   Original Report Authenticated By: Charline Bills, M.D.     Medications: I have reviewed the patient's current medications.  Assessment/Plan:  1. Metastatic colon cancer diagnosed in January 2013, right colon mass (T3 N1), status post right colectomy and biopsy of a metastatic liver lesion. Status post 11 cycles of FOLFOX/Avastin with restaging CT on 01/15/2012 revealing stable metastatic disease involving the  liver. Chemotherapy switched to weekly 5-FU/leucovorin and every 2 week Avastin beginning on 02/18/2012; last treated with 5-FU/leucovorin on 03/24/2012 and last treated with Avastin on 03/31/2012. Restaging CT evaluation 04/23/2012 showed increased hepatic metastatic disease. Cycle 1 of FOLFIRI chemotherapy 05/04/2012; cycle 2 05/26/2012; cycle 3 06/08/2012; cycle 4 06/22/2012. Cycle 5 on 07/06/2012; cycle 6 on 07/27/2012. Restaging CT abdomen 08/06/2012 showed progression of multiple hepatic metastases. 2. Microcytic anemia. Question iron deficiency. Ferritin was normal on 04/20/2012. Hemoglobin remains stable. 3. Oxaliplatin neuropathy.  4. Positive K-ras mutation on bloc labeled [id number] from [place], [place]. 5. Delayed nausea following cycle 2 FOLFIRI. Aloxi added beginning with cycle 3. 6. CEA 56.5 on 04/20/2012; 159 on 06/15/2012; 136 on 06/22/2012; 141 on 07/06/2012. 7. Skin infection over the left breast-status post a drainage procedure by Dr. Chilton Si. He completed a course of Augmentin. The wound has healed.  Disposition-Nathan Robbins has completed 6 cycles of FOLFIRI chemotherapy. Restaging CT evaluation 08/06/2012 shows evidence of progression of liver metastases. Findings were reviewed with Mr. Baird. Dr. Truett Perna recommends a referral to Dr. Maryruth Hancock at [facility] to consider treatment with Xeloda/Aflibercept. We also discussed Y. 90 therapy. Mr. Pereira will return for a followup visit with Dr. Truett Perna on 08/30/2012.  Patient seen with Dr. Truett Perna.  Lonna Cobb ANP/GNP-BC

## 2012-08-11 LAB — CEA: CEA: 97.6 ng/mL — ABNORMAL HIGH (ref 0.0–5.0)

## 2012-08-16 ENCOUNTER — Telehealth: Payer: Self-pay | Admitting: *Deleted

## 2012-08-16 NOTE — Telephone Encounter (Signed)
Received message from pt asking "do I need my port flushed?"  Called and spoke with pt instructed him he can get his port flushed after his appt on 08/30/12 with Dr. Truett Perna.  Asked "do I still need to see Dr. Wilson Singer have appt at [facility] on 09/01/12 at 3pm, I thought Dr. Truett Perna wanted to see me after that?"  Informed pt office would clarify and return call.  Pt verbalized understanding.

## 2012-08-17 ENCOUNTER — Other Ambulatory Visit: Payer: Self-pay | Admitting: *Deleted

## 2012-08-17 NOTE — Progress Notes (Signed)
Per Dr. Truett Perna, can schedule patient to be seen a week after his appointment at [facility]. POF to scheduler.

## 2012-08-18 ENCOUNTER — Telehealth: Payer: Self-pay | Admitting: Oncology

## 2012-08-18 NOTE — Telephone Encounter (Signed)
S/w the pt and he is aware of his April 22nd appt

## 2012-08-23 ENCOUNTER — Telehealth: Payer: Self-pay | Admitting: Oncology

## 2012-08-23 NOTE — Telephone Encounter (Signed)
Pt appt. With Dr. Maryruth Hancock @ [facility]-09/01/12@3 :00. Faxed medical records. Pt aware to hand carry cd to appt.

## 2012-08-30 ENCOUNTER — Ambulatory Visit: Payer: Medicaid Other | Admitting: Oncology

## 2012-09-07 ENCOUNTER — Ambulatory Visit (HOSPITAL_BASED_OUTPATIENT_CLINIC_OR_DEPARTMENT_OTHER): Payer: Medicaid Other | Admitting: Nurse Practitioner

## 2012-09-07 ENCOUNTER — Telehealth: Payer: Self-pay | Admitting: Oncology

## 2012-09-07 VITALS — BP 129/74 | HR 70 | Temp 97.1°F | Resp 20 | Ht 75.0 in | Wt 184.1 lb

## 2012-09-07 DIAGNOSIS — C189 Malignant neoplasm of colon, unspecified: Secondary | ICD-10-CM

## 2012-09-07 DIAGNOSIS — C182 Malignant neoplasm of ascending colon: Secondary | ICD-10-CM

## 2012-09-07 DIAGNOSIS — C787 Secondary malignant neoplasm of liver and intrahepatic bile duct: Secondary | ICD-10-CM

## 2012-09-07 DIAGNOSIS — G620 Drug-induced polyneuropathy: Secondary | ICD-10-CM

## 2012-09-07 DIAGNOSIS — D509 Iron deficiency anemia, unspecified: Secondary | ICD-10-CM

## 2012-09-07 NOTE — Telephone Encounter (Signed)
gv pt appt schedule for June.  °

## 2012-09-07 NOTE — Progress Notes (Signed)
OFFICE PROGRESS NOTE  Interval history:  [name] returns as scheduled. He reports he has qualified for the Xeloda/Aflibercept trial at [facility]. He has a followup appointment later this week. Over the past several days he has noted increased pain at the right abdomen. He is taking oxycodone as needed. Appetite is somewhat diminished. He denies nausea/vomiting. Bowels moving regularly.   Objective: Blood pressure 129/74, pulse 70, temperature 97.1 F (36.2 C), temperature source Oral, resp. rate 20, height 6\' 3"  (1.905 m), weight 184 lb 1.6 oz (83.507 kg).  No thrush or ulceration. Lungs are clear. Regular cardiac rhythm. Port-A-Cath site is without erythema. Liver is palpable at the medial right upper abdomen causing midline. Extremities without edema.  Lab Results: Lab Results  Component Value Date   WBC 7.1 08/10/2012   HGB 9.1* 08/10/2012   HCT 29.9* 08/10/2012   MCV 76.1* 08/10/2012   PLT 405* 08/10/2012    Chemistry:    Chemistry      Component Value Date/Time   NA 135* 08/10/2012 1118   NA 135 07/20/2012 1310   K 3.9 08/10/2012 1118   K 4.0 07/20/2012 1310   CL 101 08/10/2012 1118   CL 99 07/20/2012 1310   CO2 25 08/10/2012 1118   CO2 24 07/20/2012 1310   BUN 10.1 08/10/2012 1118   BUN 8 07/20/2012 1310   CREATININE 1.4* 08/10/2012 1118   CREATININE 1.21 07/20/2012 1310      Component Value Date/Time   CALCIUM 9.1 08/10/2012 1118   CALCIUM 8.9 07/20/2012 1310   ALKPHOS 200* 08/10/2012 1118   ALKPHOS 177* 07/20/2012 1310   AST 49* 08/10/2012 1118   AST 41* 07/20/2012 1310   ALT 26 08/10/2012 1118   ALT 23 07/20/2012 1310   BILITOT 0.40 08/10/2012 1118   BILITOT 0.2* 07/20/2012 1310       Studies/Results: No results found.  Medications: I have reviewed the patient's current medications.  Assessment/Plan:  1. Metastatic colon cancer diagnosed in January 2013, right colon mass (T3 N1), status post right colectomy and biopsy of a metastatic liver lesion. Status post 11 cycles of FOLFOX/Avastin  with restaging CT on 01/15/2012 revealing stable metastatic disease involving the liver. Chemotherapy switched to weekly 5-FU/leucovorin and every 2 week Avastin beginning on 02/18/2012; last treated with 5-FU/leucovorin on 03/24/2012 and last treated with Avastin on 03/31/2012. Restaging CT evaluation 04/23/2012 showed increased hepatic metastatic disease. Cycle 1 of FOLFIRI chemotherapy 05/04/2012; cycle 2 05/26/2012; cycle 3 06/08/2012; cycle 4 06/22/2012. Cycle 5 on 07/06/2012; cycle 6 on 07/27/2012. Restaging CT abdomen 08/06/2012 showed progression of multiple hepatic metastases. 2. Microcytic anemia. Question iron deficiency. Ferritin was normal on 04/20/2012. Hemoglobin stable on 08/10/2012. 3. Oxaliplatin neuropathy.  4. Positive K-ras mutation on bloc labeled [id number] from [place], [place]. 5. Delayed nausea following cycle 2 FOLFIRI. Aloxi added beginning with cycle 3. 6. CEA 56.5 on 04/20/2012; 159 on 06/15/2012; 136 on 06/22/2012; 141 on 07/06/2012; 97.6 on 08/10/2012. 7. Skin infection over the left breast-status post a drainage procedure by Dr. Chilton Si. He completed a course of Augmentin. The wound has healed. 8. Pain at right abdomen secondary to hepatomegaly/liver metastases. He is taking oxycodone as needed.  Disposition-[name] is scheduled to begin treatment with Xeloda/Aflibercept at [facility] in the near future. We will schedule a return visit here in 2 months but will be happy to see him prior to that visit with any problems.  Plan reviewed with Dr. Truett Perna.  Lonna Cobb ANP/GNP-BC

## 2012-09-13 ENCOUNTER — Telehealth: Payer: Self-pay | Admitting: *Deleted

## 2012-09-13 DIAGNOSIS — C189 Malignant neoplasm of colon, unspecified: Secondary | ICD-10-CM

## 2012-09-13 MED ORDER — OXYCODONE HCL 5 MG PO CAPS: 5.0000 mg | ORAL_CAPSULE | ORAL | Status: DC | PRN

## 2012-09-13 NOTE — Telephone Encounter (Signed)
Patient called to report increase in his abdominal pain and that he needs a refill on pain medication. Also needs emergency food assistance saying that social worker is not returning his call and "the lady" at department "lied" to me about getting me help. Says he needs help today.

## 2012-09-13 NOTE — Telephone Encounter (Signed)
Notified patient that his script is ready for pick up at cancer center. He reports he goes to [facility] on 09/16/12 for his 1st treatment. He also informed nurse that he has spoken with social worker, Cammy Copa today. Thanks nurse for her help.

## 2012-09-13 NOTE — Telephone Encounter (Signed)
Left message for triage nurse at [facility] to inquire when next appointment there is and when he begins treatment?

## 2012-09-16 ENCOUNTER — Other Ambulatory Visit: Payer: Self-pay | Admitting: Oncology

## 2012-09-16 DIAGNOSIS — C189 Malignant neoplasm of colon, unspecified: Secondary | ICD-10-CM

## 2012-09-17 ENCOUNTER — Telehealth: Payer: Self-pay | Admitting: Oncology

## 2012-09-17 ENCOUNTER — Other Ambulatory Visit: Payer: Self-pay | Admitting: *Deleted

## 2012-09-17 NOTE — Progress Notes (Signed)
Per Dr. Truett Perna: Needs to see patient next week-schedule for 5/8 at 1230.

## 2012-09-17 NOTE — Telephone Encounter (Signed)
lvm for pt regarding to May appt per 5.2.14 pof

## 2012-09-21 ENCOUNTER — Telehealth: Payer: Self-pay | Admitting: Oncology

## 2012-09-21 ENCOUNTER — Ambulatory Visit: Payer: Medicaid Other | Admitting: Lab

## 2012-09-21 ENCOUNTER — Telehealth: Payer: Self-pay | Admitting: *Deleted

## 2012-09-21 ENCOUNTER — Ambulatory Visit (HOSPITAL_BASED_OUTPATIENT_CLINIC_OR_DEPARTMENT_OTHER): Payer: Medicaid Other | Admitting: Oncology

## 2012-09-21 VITALS — BP 139/86 | HR 81 | Temp 96.9°F | Resp 18 | Ht 75.0 in | Wt 171.7 lb

## 2012-09-21 DIAGNOSIS — C182 Malignant neoplasm of ascending colon: Secondary | ICD-10-CM

## 2012-09-21 DIAGNOSIS — G893 Neoplasm related pain (acute) (chronic): Secondary | ICD-10-CM

## 2012-09-21 DIAGNOSIS — D509 Iron deficiency anemia, unspecified: Secondary | ICD-10-CM

## 2012-09-21 DIAGNOSIS — C787 Secondary malignant neoplasm of liver and intrahepatic bile duct: Secondary | ICD-10-CM

## 2012-09-21 DIAGNOSIS — C189 Malignant neoplasm of colon, unspecified: Secondary | ICD-10-CM

## 2012-09-21 MED ORDER — ONDANSETRON HCL 8 MG PO TABS: 8.0000 mg | ORAL_TABLET | Freq: Three times a day (TID) | ORAL | Status: DC | PRN

## 2012-09-21 MED ORDER — PREDNISONE 10 MG PO TABS: 10.0000 mg | ORAL_TABLET | Freq: Every day | ORAL | Status: DC

## 2012-09-21 NOTE — Telephone Encounter (Signed)
gv and printed appt sched for pt for May...emailed MB to add tx.

## 2012-09-21 NOTE — Telephone Encounter (Signed)
Patient calling in to report that he is "just miserable" He feels a knot in his stomach, has pain, pain meds don't work, and is vomiting bile. He says chemo was stopped because it was not working. He is requesting to see Dr Truett Perna today. States he just can not take this anymore and can not wait until thursdays appt. Spoke with Dr Truett Perna, have patient come in if he can be here in next few minutes, called patient, he is on the way. Scheduling notified.

## 2012-09-21 NOTE — Progress Notes (Signed)
   [facility]    OFFICE PROGRESS NOTE   INTERVAL HISTORY:   He returns prior to aflibercept scheduled visit. He complains of increased upper abdominal pain, nausea, and anorexia. He did not qualify for the Xeloda/Aflibercept trial at [facility] secondary to lack of insurance approval. He was transfused with packed red blood cells at [facility].  Oxycodone relieves the pain. He is having bowel movements.  Objective:  Vital signs in last 24 hours:  Blood pressure 139/86, pulse 81, temperature 96.9 F (36.1 C), temperature source Oral, resp. rate 18, height 6\' 3"  (1.905 m), weight 171 lb 11.2 oz (77.883 kg).    HEENT: No thrush or ulcers Resp: Lungs clear bilaterally Cardio: Regular rate and rhythm GI: The liver is markedly enlarged and palpable in the mid upper abdomen extending across the midline. No apparent ascites the Vascular: No leg edema   Portacath/PICC-without erythema  Lab Results:  Lab Results  Component Value Date   WBC 7.1 08/10/2012   HGB 9.1* 08/10/2012   HCT 29.9* 08/10/2012   MCV 76.1* 08/10/2012   PLT 405* 08/10/2012   ANC 3.6    Medications: I have reviewed the patient's current medications.  Assessment/Plan: 1. Metastatic colon cancer diagnosed in January 2013, right colon mass (T3 N1), status post right colectomy and biopsy of a metastatic liver lesion. Status post 11 cycles of FOLFOX/Avastin with restaging CT on 01/15/2012 revealing stable metastatic disease involving the liver. Chemotherapy switched to weekly 5-FU/leucovorin and every 2 week Avastin beginning on 02/18/2012; last treated with 5-FU/leucovorin on 03/24/2012 and last treated with Avastin on 03/31/2012. Restaging CT evaluation 04/23/2012 showed increased hepatic metastatic disease. Cycle 1 of FOLFIRI chemotherapy 05/04/2012; cycle 2 05/26/2012; cycle 3 06/08/2012; cycle 4 06/22/2012. Cycle 5 on 07/06/2012; cycle 6 on 07/27/2012. Restaging CT abdomen 08/06/2012 showed progression of  multiple hepatic metastases. 2. Microcytic anemia. Question iron deficiency. Ferritin was normal on 04/20/2012. Hemoglobin stable on 08/10/2012. He is status post a red cell transfusion at [facility] on 09/09/2012. 3. Oxaliplatin neuropathy-improved 4. 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 5. Delayed nausea following cycle 2 FOLFIRI. Aloxi added beginning with cycle 3. 6. CEA 56.5 on 04/20/2012; 159 on 06/15/2012; 136 on 06/22/2012; 141 on 07/06/2012; 97.6 on 08/10/2012. 7. Skin infection over the left breast-status post a drainage procedure by Dr. Chilton Si. He completed a course of Augmentin. The wound has healed. 8. Pain at right abdomen secondary to hepatomegaly/liver metastases. He is taking oxycodone as needed. 9. Anorexia/weight loss  Disposition:  Mr. Eberlin has progressive metastatic colon cancer. He has lost a significant amount of weight over the past several weeks. He has increased nausea and pain. Nausea is likely secondary to the liver metastases. He will begin a trial of Zofran for the nausea. He also has Phenergan to use as needed. He will begin low-dose prednisone as an appetite stimulant.  Mr. Mcgrail will return for an office visit on 09/23/2012. We discussed systemic treatment options. The chance of responding to systemic therapy is small after progressing following FOLFOX/Avastin and FOLFIRI. The tumor is K-ras mutated. We decided to begin a trial of 5-FU/Aflibercept. We reviewed the potential toxicities associated with Aflibercept including the chance for an allergic reaction, thromboembolic disease, bleeding, and hypertension. We will also refer Mr. Riesgo to interventional radiology to consider bland embolization or Y-90 to the dominant left liver disease.   Thornton Papas, MD  09/21/2012  3:58 PM

## 2012-09-23 ENCOUNTER — Telehealth: Payer: Self-pay | Admitting: Oncology

## 2012-09-23 ENCOUNTER — Other Ambulatory Visit (HOSPITAL_BASED_OUTPATIENT_CLINIC_OR_DEPARTMENT_OTHER): Payer: Medicaid Other | Admitting: Lab

## 2012-09-23 ENCOUNTER — Ambulatory Visit (HOSPITAL_BASED_OUTPATIENT_CLINIC_OR_DEPARTMENT_OTHER): Payer: Medicaid Other

## 2012-09-23 ENCOUNTER — Other Ambulatory Visit: Payer: Self-pay | Admitting: *Deleted

## 2012-09-23 ENCOUNTER — Encounter: Payer: Self-pay | Admitting: *Deleted

## 2012-09-23 ENCOUNTER — Ambulatory Visit (HOSPITAL_BASED_OUTPATIENT_CLINIC_OR_DEPARTMENT_OTHER): Payer: Medicaid Other | Admitting: Oncology

## 2012-09-23 VITALS — BP 103/56 | HR 67 | Temp 96.9°F | Resp 18 | Ht 75.0 in | Wt 182.1 lb

## 2012-09-23 DIAGNOSIS — C182 Malignant neoplasm of ascending colon: Secondary | ICD-10-CM

## 2012-09-23 DIAGNOSIS — Z5111 Encounter for antineoplastic chemotherapy: Secondary | ICD-10-CM

## 2012-09-23 DIAGNOSIS — C787 Secondary malignant neoplasm of liver and intrahepatic bile duct: Secondary | ICD-10-CM

## 2012-09-23 DIAGNOSIS — C189 Malignant neoplasm of colon, unspecified: Secondary | ICD-10-CM

## 2012-09-23 DIAGNOSIS — D509 Iron deficiency anemia, unspecified: Secondary | ICD-10-CM

## 2012-09-23 DIAGNOSIS — R109 Unspecified abdominal pain: Secondary | ICD-10-CM

## 2012-09-23 LAB — CBC WITH DIFFERENTIAL/PLATELET
Basophils Absolute: 0.1 10*3/uL (ref 0.0–0.1)
Eosinophils Absolute: 0.1 10*3/uL (ref 0.0–0.5)
HCT: 30.1 % — ABNORMAL LOW (ref 38.4–49.9)
HGB: 9.5 g/dL — ABNORMAL LOW (ref 13.0–17.1)
LYMPH%: 4.6 % — ABNORMAL LOW (ref 14.0–49.0)
MONO#: 1.7 10*3/uL — ABNORMAL HIGH (ref 0.1–0.9)
NEUT#: 12.8 10*3/uL — ABNORMAL HIGH (ref 1.5–6.5)
NEUT%: 83 % — ABNORMAL HIGH (ref 39.0–75.0)
Platelets: 511 10*3/uL — ABNORMAL HIGH (ref 140–400)
WBC: 15.4 10*3/uL — ABNORMAL HIGH (ref 4.0–10.3)

## 2012-09-23 LAB — COMPREHENSIVE METABOLIC PANEL (CC13)
ALT: 11 U/L (ref 0–55)
Albumin: 2.6 g/dL — ABNORMAL LOW (ref 3.5–5.0)
BUN: 18.7 mg/dL (ref 7.0–26.0)
CO2: 24 mEq/L (ref 22–29)
Calcium: 8.7 mg/dL (ref 8.4–10.4)
Chloride: 93 mEq/L — ABNORMAL LOW (ref 98–107)
Creatinine: 1.5 mg/dL — ABNORMAL HIGH (ref 0.7–1.3)
Potassium: 5.1 mEq/L (ref 3.5–5.1)

## 2012-09-23 MED ORDER — OXYCODONE HCL 5 MG PO CAPS: 5.0000 mg | ORAL_CAPSULE | ORAL | Status: DC | PRN

## 2012-09-23 MED ORDER — ZIV-AFLIBERCEPT CHEMO INJECTION 200 MG/8ML
4.0000 mg/kg | Freq: Once | INTRAVENOUS | Status: AC
  Administered 2012-09-23: 300 mg via INTRAVENOUS
  Filled 2012-09-23: qty 12

## 2012-09-23 MED ORDER — SODIUM CHLORIDE 0.9 % IV SOLN
Freq: Once | INTRAVENOUS | Status: AC
  Administered 2012-09-23: 14:00:00 via INTRAVENOUS

## 2012-09-23 MED ORDER — SODIUM CHLORIDE 0.9 % IV SOLN
2400.0000 mg/m2 | INTRAVENOUS | Status: DC
  Administered 2012-09-23: 4850 mg via INTRAVENOUS
  Filled 2012-09-23: qty 97

## 2012-09-23 NOTE — Telephone Encounter (Signed)
Added d/c dates for 5/8 pof. Confirmed w/desk nurse next d/c date should be 5/10 not 5/11.

## 2012-09-23 NOTE — Progress Notes (Signed)
   [facility]    OFFICE PROGRESS NOTE   INTERVAL HISTORY:   He returns as scheduled. He reports improvement in the nausea with ondansetron. He is taking prednisone. He has an improved appetite. He continues to have pain at the upper abdomen. The pain is relieved with oxycodone the  Objective:  Vital signs in last 24 hours:  Blood pressure 103/56, pulse 67, temperature 96.9 F (36.1 C), temperature source Oral, resp. rate 18, height 6\' 3"  (1.905 m), weight 182 lb 1.6 oz (82.6 kg).   Resp: Lungs clear bilaterally Cardio: Regular rate and rhythm GI: The liver is palpable throughout the upper abdomen extending across the midline Vascular: No leg edema   Portacath/PICC-without erythema  Lab Results:  Lab Results  Component Value Date   WBC 15.4* 09/23/2012   HGB 9.5* 09/23/2012   HCT 30.1* 09/23/2012   MCV 78.0* 09/23/2012   PLT 511* 09/23/2012   ANC 12.8    Medications: I have reviewed the patient's current medications.  Assessment/Plan: 1. Metastatic colon cancer diagnosed in January 2013, right colon mass (T3 N1), status post right colectomy and biopsy of a metastatic liver lesion. Status post 11 cycles of FOLFOX/Avastin with restaging CT on 01/15/2012 revealing stable metastatic disease involving the liver. Chemotherapy switched to weekly 5-FU/leucovorin and every 2 week Avastin beginning on 02/18/2012; last treated with 5-FU/leucovorin on 03/24/2012 and last treated with Avastin on 03/31/2012. Restaging CT evaluation 04/23/2012 showed increased hepatic metastatic disease. Cycle 1 of FOLFIRI chemotherapy 05/04/2012; cycle 2 05/26/2012; cycle 3 06/08/2012; cycle 4 06/22/2012. Cycle 5 on 07/06/2012; cycle 6 on 07/27/2012. Restaging CT abdomen 08/06/2012 showed progression of multiple hepatic metastases. 2. Microcytic anemia. Question iron deficiency. Ferritin was normal on 04/20/2012. Hemoglobin stable on 08/10/2012. He is status post a red cell transfusion at [facility] on  09/09/2012. 3. Oxaliplatin neuropathy-improved 4. 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 5. Delayed nausea following cycle 2 FOLFIRI. Aloxi added beginning with cycle 3. 6. CEA 56.5 on 04/20/2012; 159 on 06/15/2012; 136 on 06/22/2012; 141 on 07/06/2012; 97.6 on 08/10/2012. 7. Skin infection over the left breast-status post a drainage procedure by Dr. Chilton Si. He completed a course of Augmentin. The wound has healed. 8. Pain at right abdomen secondary to hepatomegaly/liver metastases. He is taking oxycodone as needed. 9. Anorexia/weight loss-he started prednisone 09/21/2012    Disposition:  His performance status is improved today. His case was presented at the GI tumor conference on 09/22/2012. He appears to be a candidate for bland embolization or radio embolization to the liver. He has been referred to interventional radiology. The plan is to proceed with 5-FU/Aflibercept today. We again reviewed the potential toxicities associated with Aflibercept. He agrees to proceed.  Nathan Robbins will return for an office visit and the next cycle of chemotherapy in 2 weeks. We refilled the oxycodone today.   Thornton Papas, MD  09/23/2012  1:02 PM

## 2012-09-23 NOTE — Patient Instructions (Signed)
[facility] Discharge Instructions for Patients Receiving Chemotherapy  Today you received the following chemotherapy agents Zaltrap and 5 FU.  To help prevent nausea and vomiting after your treatment, we encourage you to take your nausea medication as prescribed.   If you develop nausea and vomiting that is not controlled by your nausea medication, call the clinic. If it is after clinic hours your family physician or the after hours number for the clinic or go to the Emergency Department.   BELOW ARE SYMPTOMS THAT SHOULD BE REPORTED IMMEDIATELY:  *FEVER GREATER THAN 100.5 F  *CHILLS WITH OR WITHOUT FEVER  NAUSEA AND VOMITING THAT IS NOT CONTROLLED WITH YOUR NAUSEA MEDICATION  *UNUSUAL SHORTNESS OF BREATH  *UNUSUAL BRUISING OR BLEEDING  TENDERNESS IN MOUTH AND THROAT WITH OR WITHOUT PRESENCE OF ULCERS  *URINARY PROBLEMS  *BOWEL PROBLEMS  UNUSUAL RASH Items with * indicate a potential emergency and should be followed up as soon as possible.  One of the nurses will contact you 24 hours after your treatment. Please let the nurse know about any problems that you may have experienced. Feel free to call the clinic you have any questions or concerns. The clinic phone number is [phone].

## 2012-09-23 NOTE — Progress Notes (Signed)
Per Inetta Fermo in IR at [facility]: Location of radiology consult changed to [facility] location due to needing consultation there first. May call Tammy at [phone] with any questions.

## 2012-09-23 NOTE — Telephone Encounter (Signed)
appts scheduled and pt given appt schedule for May and June. Dates for tx added per provider's instruction via 5/8 pof. Awaiting desk nurse to get back to me regarding pump stop dates.

## 2012-09-24 ENCOUNTER — Telehealth: Payer: Self-pay | Admitting: *Deleted

## 2012-09-24 NOTE — Telephone Encounter (Signed)
Message copied by Kathlynn Grate on Fri Sep 24, 2012  4:28 PM ------      Message from: Corky Sing      Created: Thu Sep 23, 2012  3:26 PM      Regarding: Chemo follow up call      Contact: [phone]       Patient received Zaltrap and 5 FU. Dr. Alcide Evener patient. Please call.      Thanks,      Santina Evans  ------

## 2012-09-24 NOTE — Telephone Encounter (Signed)
Called patient, states he is doing just fine. No complaints of nausea/vomiting/diarrhea. Encouraged to drink plenty of fluids and have Imodium on hand in the event diarrhea starts. Patient very appreciative of call.

## 2012-09-25 ENCOUNTER — Ambulatory Visit (HOSPITAL_BASED_OUTPATIENT_CLINIC_OR_DEPARTMENT_OTHER): Payer: Medicaid Other

## 2012-09-25 VITALS — BP 154/87 | HR 67 | Temp 97.6°F | Resp 20

## 2012-09-25 DIAGNOSIS — C189 Malignant neoplasm of colon, unspecified: Secondary | ICD-10-CM

## 2012-09-25 DIAGNOSIS — C182 Malignant neoplasm of ascending colon: Secondary | ICD-10-CM

## 2012-09-25 MED ORDER — HEPARIN SOD (PORK) LOCK FLUSH 100 UNIT/ML IV SOLN
500.0000 [IU] | Freq: Once | INTRAVENOUS | Status: AC | PRN
  Administered 2012-09-25: 500 [IU]
  Filled 2012-09-25: qty 5

## 2012-09-25 MED ORDER — SODIUM CHLORIDE 0.9 % IJ SOLN
10.0000 mL | INTRAMUSCULAR | Status: DC | PRN
  Administered 2012-09-25: 10 mL
  Filled 2012-09-25: qty 10

## 2012-10-02 ENCOUNTER — Encounter (HOSPITAL_COMMUNITY): Payer: Self-pay | Admitting: *Deleted

## 2012-10-02 ENCOUNTER — Emergency Department (HOSPITAL_COMMUNITY): Payer: Medicaid Other

## 2012-10-02 ENCOUNTER — Emergency Department (HOSPITAL_COMMUNITY)
Admission: EM | Admit: 2012-10-02 | Discharge: 2012-10-02 | Disposition: A | Discharge status: Home / Self Care | Payer: Medicaid Other | Attending: Emergency Medicine | Admitting: Emergency Medicine

## 2012-10-02 DIAGNOSIS — Z79899 Other long term (current) drug therapy: Secondary | ICD-10-CM | POA: Insufficient documentation

## 2012-10-02 DIAGNOSIS — K5900 Constipation, unspecified: Secondary | ICD-10-CM | POA: Insufficient documentation

## 2012-10-02 DIAGNOSIS — R0602 Shortness of breath: Secondary | ICD-10-CM | POA: Insufficient documentation

## 2012-10-02 DIAGNOSIS — R194 Change in bowel habit: Secondary | ICD-10-CM

## 2012-10-02 DIAGNOSIS — Z85038 Personal history of other malignant neoplasm of large intestine: Secondary | ICD-10-CM | POA: Insufficient documentation

## 2012-10-02 DIAGNOSIS — I10 Essential (primary) hypertension: Secondary | ICD-10-CM | POA: Insufficient documentation

## 2012-10-02 NOTE — ED Provider Notes (Signed)
History     CSN: [id number]  Arrival date & time 10/02/12  2016   First MD Initiated Contact with Patient 10/02/12 2119      Chief Complaint  Patient presents with  . Constipation    (Consider location/radiation/quality/duration/timing/severity/associated sxs/prior treatment) HPI Patient complaining of bowel movement 4-5 days ago.  Patient taking oxycodone for pain from metastatic colon cancer.  Patient completed last chemo last Saturday.  States decreased solid and taking liquids.  Patient states he has hard stool in rectum.  Gave himself an enema and had some hard lumps of stool.  States taking stool softener, miralax.  No nausea or vomiting some epigastric discomfort like with previous colon cancer pain.  Last oxycodone two days ago.  pmd Dr. Malen Gauze [facility].  Oncology Dr. Truett Perna Past Medical History  Diagnosis Date  . colon ca dx'd 2013  . Hypertension     History reviewed. No pertinent past surgical history.  History reviewed. No pertinent family history.  History  Substance Use Topics  . Smoking status: Not on file  . Smokeless tobacco: Not on file  . Alcohol Use: Not on file      Review of Systems  Constitutional: Positive for unexpected weight change.  Respiratory: Positive for shortness of breath.   Cardiovascular: Negative.   Gastrointestinal: Negative.   Genitourinary: Negative.   Musculoskeletal: Negative.   Skin: Negative.   Allergic/Immunologic: Negative.   Neurological: Negative.   Psychiatric/Behavioral: Negative.     Allergies  Review of patient's allergies indicates no known allergies.  Home Medications   Current Outpatient Rx  Name  Route  Sig  Dispense  Refill  . diltiazem (TIAZAC) 360 MG 24 hr capsule   Oral   Take 360 mg by mouth every morning.          . docusate sodium (COLACE) 100 MG capsule   Oral   Take 100 mg by mouth 2 (two) times daily as needed for constipation.         Marland Kitchen FLUoxetine (PROZAC) 20 MG capsule  Oral   Take 20 mg by mouth every morning.         Marland Kitchen oxycodone (OXY-IR) 5 MG capsule   Oral   Take 1-2 capsules (5-10 mg total) by mouth every 4 (four) hours as needed for pain.   100 capsule   0   . polyethylene glycol (MIRALAX / GLYCOLAX) packet   Oral   Take 17 g by mouth 2 (two) times daily as needed (constipation).         . predniSONE (DELTASONE) 10 MG tablet   Oral   Take 10 mg by mouth every morning. To stimulate appetite           BP 149/110  Pulse 67  Temp(Src) 98.1 F (36.7 C) (Oral)  Resp 18  SpO2 100%  Physical Exam  Nursing note and vitals reviewed. Constitutional: He is oriented to person, place, and time. He appears well-developed and well-nourished.  thin  HENT:  Head: Normocephalic and atraumatic.  Right Ear: External ear normal.  Left Ear: External ear normal.  Nose: Nose normal.  Mouth/Throat: Oropharynx is clear and moist.  Eyes: Conjunctivae and EOM are normal. Pupils are equal, round, and reactive to light.  Neck: Normal range of motion. Neck supple.  Cardiovascular: Normal rate, regular rhythm and normal heart sounds.   Pulmonary/Chest: Effort normal and breath sounds normal.  Abdominal: Soft. Bowel sounds are normal.  Genitourinary: Rectum normal.  Musculoskeletal: Normal range  of motion.  Neurological: He is alert and oriented to person, place, and time. He has normal reflexes.  Skin: Skin is warm and dry.  Psychiatric: He has a normal mood and affect. His behavior is normal. Judgment and thought content normal.    ED Course  Procedures (including critical care time)  Labs Reviewed - No data to display No results found.   No diagnosis found.    MDM  No evidence of increased stool on plain films no obstruction.  I suspect the pain is from his metastatic colon cancer but he is advised regarding bowel hygeine and will follow up with his doctor this week.        Hilario Quarry, MD 10/02/12 [phone]

## 2012-10-02 NOTE — ED Notes (Signed)
Pt to the ED with c/o constipation. Pt reports he started taking a new chemo medication which he believes has made him constipated. Pt states he stopped taken his OxyContin last Thursday, pt denies pain at this time. Pt reports last BM was last night, pt had manually disimpact himself last night. Pt reports taking stool softeners, miralx, and tea without relief. Pt is A&Ox4, respirations equal and unlabored, skin warm and dry. Pt has hx of colon cancer that has now spread to his liver. Family at bedside

## 2012-10-05 ENCOUNTER — Ambulatory Visit
Admission: RE | Admit: 2012-10-05 | Discharge: 2012-10-05 | Disposition: A | Discharge status: Home / Self Care | Payer: Medicaid Other | Source: Ambulatory Visit | Attending: Oncology | Admitting: Oncology

## 2012-10-05 DIAGNOSIS — C189 Malignant neoplasm of colon, unspecified: Secondary | ICD-10-CM

## 2012-10-06 ENCOUNTER — Other Ambulatory Visit (HOSPITAL_COMMUNITY): Payer: Self-pay | Admitting: Interventional Radiology

## 2012-10-06 DIAGNOSIS — C787 Secondary malignant neoplasm of liver and intrahepatic bile duct: Secondary | ICD-10-CM

## 2012-10-06 DIAGNOSIS — C189 Malignant neoplasm of colon, unspecified: Secondary | ICD-10-CM

## 2012-10-07 ENCOUNTER — Ambulatory Visit (HOSPITAL_BASED_OUTPATIENT_CLINIC_OR_DEPARTMENT_OTHER): Payer: Medicaid Other

## 2012-10-07 ENCOUNTER — Other Ambulatory Visit: Payer: Self-pay | Admitting: *Deleted

## 2012-10-07 ENCOUNTER — Other Ambulatory Visit: Payer: Self-pay | Admitting: Interventional Radiology

## 2012-10-07 ENCOUNTER — Telehealth: Payer: Self-pay | Admitting: Oncology

## 2012-10-07 ENCOUNTER — Other Ambulatory Visit (HOSPITAL_BASED_OUTPATIENT_CLINIC_OR_DEPARTMENT_OTHER): Payer: Medicaid Other

## 2012-10-07 ENCOUNTER — Ambulatory Visit (HOSPITAL_BASED_OUTPATIENT_CLINIC_OR_DEPARTMENT_OTHER): Payer: Medicaid Other | Admitting: Oncology

## 2012-10-07 VITALS — BP 163/95 | HR 77 | Temp 97.0°F | Resp 19 | Ht 75.0 in | Wt 172.8 lb

## 2012-10-07 DIAGNOSIS — D509 Iron deficiency anemia, unspecified: Secondary | ICD-10-CM

## 2012-10-07 DIAGNOSIS — C189 Malignant neoplasm of colon, unspecified: Secondary | ICD-10-CM

## 2012-10-07 DIAGNOSIS — Z5111 Encounter for antineoplastic chemotherapy: Secondary | ICD-10-CM

## 2012-10-07 DIAGNOSIS — C787 Secondary malignant neoplasm of liver and intrahepatic bile duct: Secondary | ICD-10-CM

## 2012-10-07 DIAGNOSIS — G893 Neoplasm related pain (acute) (chronic): Secondary | ICD-10-CM

## 2012-10-07 DIAGNOSIS — C182 Malignant neoplasm of ascending colon: Secondary | ICD-10-CM

## 2012-10-07 LAB — CBC WITH DIFFERENTIAL/PLATELET
BASO%: 0.3 % (ref 0.0–2.0)
HCT: 33.9 % — ABNORMAL LOW (ref 38.4–49.9)
HGB: 10.8 g/dL — ABNORMAL LOW (ref 13.0–17.1)
MCHC: 31.9 g/dL — ABNORMAL LOW (ref 32.0–36.0)
MONO#: 1.5 10*3/uL — ABNORMAL HIGH (ref 0.1–0.9)
NEUT%: 78.3 % — ABNORMAL HIGH (ref 39.0–75.0)
RDW: 20.3 % — ABNORMAL HIGH (ref 11.0–14.6)
WBC: 15.1 10*3/uL — ABNORMAL HIGH (ref 4.0–10.3)
lymph#: 1.6 10*3/uL (ref 0.9–3.3)

## 2012-10-07 MED ORDER — MORPHINE SULFATE ER 15 MG PO TBCR: 15.0000 mg | EXTENDED_RELEASE_TABLET | Freq: Two times a day (BID) | ORAL | Status: DC

## 2012-10-07 MED ORDER — OXYCODONE HCL 5 MG PO CAPS: 5.0000 mg | ORAL_CAPSULE | ORAL | Status: DC | PRN

## 2012-10-07 MED ORDER — SODIUM CHLORIDE 0.9 % IV SOLN
2400.0000 mg/m2 | INTRAVENOUS | Status: DC
  Administered 2012-10-07: 4850 mg via INTRAVENOUS
  Filled 2012-10-07: qty 97

## 2012-10-07 NOTE — Progress Notes (Signed)
[facility]    OFFICE PROGRESS NOTE   INTERVAL HISTORY:   He returns as scheduled. He completed a first cycle of 5-fluorouracil and Aflibercept on 09/23/2012. No acute nausea, diarrhea, or bleeding. He was evaluated by interventional radiology earlier this week. He had an episode of nausea/vomiting and diarrhea after this appointment. Nathan Robbins continues to have severe pain in the upper abdomen. He takes oxycodone every 4 hours. He was seen in the emergency room on 10/02/2012 with constipation. This has resolved.  He noted induration and drainage of a greenish material from the right areola this week. This has resolved.  Objective:  Vital signs in last 24 hours:  Blood pressure 163/95, pulse 77, temperature 97 F (36.1 C), temperature source Oral, resp. rate 19, height 6\' 3"  (1.905 m), weight 172 lb 12.8 oz (78.382 kg).    HEENT: No thrush or ulcers Resp: Lungs clear bilaterally Cardio: Regular rate and rhythm GI: The liver is markedly enlarged and extends across the midline. No apparent ascites. Vascular: No leg edema  Skin: Mild induration with a less than 1 cm soft slightly nodular area at the left side of the areola. I cannot express any drainage. No surrounding erythema.   Portacath/PICC-without erythema  Lab Results:  Lab Results  Component Value Date   WBC 15.1* 10/07/2012   HGB 10.8* 10/07/2012   HCT 33.9* 10/07/2012   MCV 78.7* 10/07/2012   PLT 335 10/07/2012   ANC 11.9    Medications: I have reviewed the patient's current medications.  Assessment/Plan: 1. Metastatic colon cancer diagnosed in January 2013, right colon mass (T3 N1), status post right colectomy and biopsy of a metastatic liver lesion. Status post 11 cycles of FOLFOX/Avastin with restaging CT on 01/15/2012 revealing stable metastatic disease involving the liver. Chemotherapy switched to weekly 5-FU/leucovorin and every 2 week Avastin beginning on 02/18/2012; last treated with  5-FU/leucovorin on 03/24/2012 and last treated with Avastin on 03/31/2012. Restaging CT evaluation 04/23/2012 showed increased hepatic metastatic disease. Cycle 1 of FOLFIRI chemotherapy 05/04/2012; cycle 2 05/26/2012; cycle 3 06/08/2012; cycle 4 06/22/2012. Cycle 5 on 07/06/2012; cycle 6 on 07/27/2012. Restaging CT abdomen 08/06/2012 showed progression of multiple hepatic metastases. -Initiation of infusional 5-FU and Aflibercept on 09/23/2012 2. Microcytic anemia. Question iron deficiency. Ferritin was normal on 04/20/2012. Hemoglobin stable on 08/10/2012. He is status post a red cell transfusion at [facility] on 09/09/2012. 3. Oxaliplatin neuropathy-improved 4. Positive K-ras mutation on bloc labeled [id number] from [place], [place]. 5. Delayed nausea following cycle 2 FOLFIRI. Aloxi added beginning with cycle 3. 6. CEA 56.5 on 04/20/2012; 159 on 06/15/2012; 136 on 06/22/2012; 141 on 07/06/2012; 97.6 on 08/10/2012. 7. Skin infection over the left breast-status post a drainage procedure by Dr. Chilton Si. He completed a course of Augmentin. The wound has healed. He now has induration and a report of drainage at the right areola 8. Pain at right abdomen secondary to hepatomegaly/liver metastases. He is taking oxycodone as needed. We will add MS Contin today 9. Anorexia/weight loss-he started prednisone 05/06/201  Disposition:  Nathan Robbins continues to have pain secondary to extensive liver metastases. He will begin MS Contin. He was evaluated in interventional radiology and is felt to be a candidate for radio embolization. This will be scheduled for within the next few weeks. We will place the Aflibercept on hold and continue the infusional 5-fluorouracil. Nathan Robbins will return for an office visit and chemotherapy in 2 weeks. He will contact us for recurrent drainage or erythema at  the right breast.   Thornton Papas, MD  10/07/2012  1:25 PM

## 2012-10-07 NOTE — Telephone Encounter (Signed)
Gave pt appt for lab, MD and chemo for June 2014, emailed michelle regarding chemo adjustment

## 2012-10-07 NOTE — Patient Instructions (Signed)
[facility] Discharge Instructions for Patients Receiving Chemotherapy  Today you received the following chemotherapy agents 5FU.  To help prevent nausea and vomiting after your treatment, we encourage you to take your nausea medication.   If you develop nausea and vomiting that is not controlled by your nausea medication, call the clinic.   BELOW ARE SYMPTOMS THAT SHOULD BE REPORTED IMMEDIATELY:  *FEVER GREATER THAN 100.5 F  *CHILLS WITH OR WITHOUT FEVER  NAUSEA AND VOMITING THAT IS NOT CONTROLLED WITH YOUR NAUSEA MEDICATION  *UNUSUAL SHORTNESS OF BREATH  *UNUSUAL BRUISING OR BLEEDING  TENDERNESS IN MOUTH AND THROAT WITH OR WITHOUT PRESENCE OF ULCERS  *URINARY PROBLEMS  *BOWEL PROBLEMS  UNUSUAL RASH Items with * indicate a potential emergency and should be followed up as soon as possible.  Feel free to call the clinic you have any questions or concerns. The clinic phone number is [phone].    

## 2012-10-08 ENCOUNTER — Encounter: Payer: Self-pay | Admitting: Oncology

## 2012-10-08 ENCOUNTER — Other Ambulatory Visit: Payer: Self-pay | Admitting: Certified Registered Nurse Anesthetist

## 2012-10-08 NOTE — Progress Notes (Signed)
[facility] [employer] approved morphine sulfate er 15mg  from 10/08/12-10/08/13.

## 2012-10-09 ENCOUNTER — Ambulatory Visit (HOSPITAL_BASED_OUTPATIENT_CLINIC_OR_DEPARTMENT_OTHER): Payer: Medicaid Other

## 2012-10-09 VITALS — BP 143/69 | HR 61 | Temp 96.8°F

## 2012-10-09 DIAGNOSIS — C787 Secondary malignant neoplasm of liver and intrahepatic bile duct: Secondary | ICD-10-CM

## 2012-10-09 DIAGNOSIS — C189 Malignant neoplasm of colon, unspecified: Secondary | ICD-10-CM

## 2012-10-09 DIAGNOSIS — Z452 Encounter for adjustment and management of vascular access device: Secondary | ICD-10-CM

## 2012-10-09 DIAGNOSIS — C182 Malignant neoplasm of ascending colon: Secondary | ICD-10-CM

## 2012-10-09 MED ORDER — HEPARIN SOD (PORK) LOCK FLUSH 100 UNIT/ML IV SOLN
500.0000 [IU] | Freq: Once | INTRAVENOUS | Status: AC | PRN
  Administered 2012-10-09: 500 [IU]
  Filled 2012-10-09: qty 5

## 2012-10-09 MED ORDER — SODIUM CHLORIDE 0.9 % IJ SOLN
10.0000 mL | INTRAMUSCULAR | Status: DC | PRN
  Administered 2012-10-09: 10 mL
  Filled 2012-10-09: qty 10

## 2012-10-12 ENCOUNTER — Other Ambulatory Visit: Payer: Self-pay | Admitting: Certified Registered Nurse Anesthetist

## 2012-10-12 ENCOUNTER — Other Ambulatory Visit: Payer: Self-pay | Admitting: Interventional Radiology

## 2012-10-12 DIAGNOSIS — C787 Secondary malignant neoplasm of liver and intrahepatic bile duct: Secondary | ICD-10-CM

## 2012-10-13 ENCOUNTER — Telehealth: Payer: Self-pay | Admitting: Oncology

## 2012-10-13 NOTE — Telephone Encounter (Signed)
Called pt and left message regarding appt for 6/5, lab, ML and chemo

## 2012-10-15 ENCOUNTER — Telehealth: Payer: Self-pay | Admitting: Oncology

## 2012-10-15 NOTE — Telephone Encounter (Signed)
Called pt and left message regarding appt for June 2014 lab, ML and chemo

## 2012-10-17 ENCOUNTER — Other Ambulatory Visit: Payer: Self-pay | Admitting: Oncology

## 2012-10-21 ENCOUNTER — Ambulatory Visit (HOSPITAL_BASED_OUTPATIENT_CLINIC_OR_DEPARTMENT_OTHER): Payer: Medicaid Other | Admitting: Oncology

## 2012-10-21 ENCOUNTER — Ambulatory Visit (HOSPITAL_BASED_OUTPATIENT_CLINIC_OR_DEPARTMENT_OTHER): Payer: Medicaid Other

## 2012-10-21 ENCOUNTER — Other Ambulatory Visit (HOSPITAL_BASED_OUTPATIENT_CLINIC_OR_DEPARTMENT_OTHER): Payer: Medicaid Other | Admitting: Lab

## 2012-10-21 VITALS — BP 158/96 | HR 112 | Temp 98.1°F | Resp 18 | Ht 75.0 in | Wt 172.8 lb

## 2012-10-21 DIAGNOSIS — C787 Secondary malignant neoplasm of liver and intrahepatic bile duct: Secondary | ICD-10-CM

## 2012-10-21 DIAGNOSIS — Z5111 Encounter for antineoplastic chemotherapy: Secondary | ICD-10-CM

## 2012-10-21 DIAGNOSIS — C182 Malignant neoplasm of ascending colon: Secondary | ICD-10-CM

## 2012-10-21 DIAGNOSIS — C189 Malignant neoplasm of colon, unspecified: Secondary | ICD-10-CM

## 2012-10-21 DIAGNOSIS — G893 Neoplasm related pain (acute) (chronic): Secondary | ICD-10-CM

## 2012-10-21 DIAGNOSIS — D649 Anemia, unspecified: Secondary | ICD-10-CM

## 2012-10-21 LAB — CBC WITH DIFFERENTIAL/PLATELET
BASO%: 0.2 % (ref 0.0–2.0)
HCT: 30.3 % — ABNORMAL LOW (ref 38.4–49.9)
LYMPH%: 14 % (ref 14.0–49.0)
MCHC: 32 g/dL (ref 32.0–36.0)
MONO#: 3.1 10*3/uL — ABNORMAL HIGH (ref 0.1–0.9)
NEUT%: 69.9 % (ref 39.0–75.0)
Platelets: 470 10*3/uL — ABNORMAL HIGH (ref 140–400)
WBC: 19.8 10*3/uL — ABNORMAL HIGH (ref 4.0–10.3)

## 2012-10-21 LAB — COMPREHENSIVE METABOLIC PANEL (CC13)
AST: 42 U/L — ABNORMAL HIGH (ref 5–34)
Alkaline Phosphatase: 203 U/L — ABNORMAL HIGH (ref 40–150)
BUN: 10.3 mg/dL (ref 7.0–26.0)
Creatinine: 1 mg/dL (ref 0.7–1.3)
Glucose: 92 mg/dl (ref 70–99)
Total Bilirubin: 1.26 mg/dL — ABNORMAL HIGH (ref 0.20–1.20)

## 2012-10-21 MED ORDER — FLUOROURACIL CHEMO INJECTION 5 GM/100ML
2400.0000 mg/m2 | INTRAVENOUS | Status: DC
  Administered 2012-10-21: 4850 mg via INTRAVENOUS
  Filled 2012-10-21: qty 97

## 2012-10-21 MED ORDER — OXYCODONE-ACETAMINOPHEN 5-325 MG PO TABS
1.0000 | ORAL_TABLET | Freq: Once | ORAL | Status: AC
  Administered 2012-10-21: 1 via ORAL

## 2012-10-21 MED ORDER — TRAZODONE HCL 50 MG PO TABS: 50.0000 mg | ORAL_TABLET | Freq: Every day | ORAL | Status: DC

## 2012-10-21 NOTE — Progress Notes (Signed)
[facility]    OFFICE PROGRESS NOTE   INTERVAL HISTORY:   He returns as scheduled. Nathan Robbins was last treated with infusional 5-FU on 10/07/2012. He complains of malaise. He discontinued MS Contin secondary to somnolence. He continues to have abdominal pain, but he is currently not taking pain medication. He reports diarrhea for the past 3 days. He had 4 loose stools today, small volume. He has not tried Imodium.  Mr. Blaker was evaluated in interventional radiology. He is a candidate for Y-90 therapy, but this has not been approved by his insurance.  Objective:  Vital signs in last 24 hours:  Blood pressure 158/96, pulse 112, temperature 98.1 F (36.7 C), temperature source Oral, resp. rate 18, height 6\' 3"  (1.905 m), weight 172 lb 12.8 oz (78.382 kg), SpO2 100.00%.    HEENT: No thrush or ulcers, the mucous membranes are moist Resp: Lungs clear bilaterally Cardio: Regular rate and rhythm GI: Markedly hepatomegaly extending across the midline, no apparent ascites Vascular: No leg edema  Skin: Mild hyperpigmentation at the hands   Portacath/PICC-without erythema  Lab Results:  Lab Results  Component Value Date   WBC 19.8* 10/21/2012   HGB 9.7* 10/21/2012   HCT 30.3* 10/21/2012   MCV 76.9* 10/21/2012   PLT 470* 10/21/2012   ANC 13.9    Medications: I have reviewed the patient's current medications.  Assessment/Plan: 1. Metastatic colon cancer diagnosed in January 2013, right colon mass (T3 N1), status post right colectomy and biopsy of a metastatic liver lesion. Status post 11 cycles of FOLFOX/Avastin with restaging CT on 01/15/2012 revealing stable metastatic disease involving the liver. Chemotherapy switched to weekly 5-FU/leucovorin and every 2 week Avastin beginning on 02/18/2012; last treated with 5-FU/leucovorin on 03/24/2012 and last treated with Avastin on 03/31/2012. Restaging CT evaluation 04/23/2012 showed increased hepatic metastatic disease. Cycle 1  of FOLFIRI chemotherapy 05/04/2012; cycle 2 05/26/2012; cycle 3 06/08/2012; cycle 4 06/22/2012. Cycle 5 on 07/06/2012; cycle 6 on 07/27/2012. Restaging CT abdomen 08/06/2012 showed progression of multiple hepatic metastases. -Initiation of infusional 5-FU and Aflibercept on 09/23/2012 . Aflibercept was placed on hold after the first cycle secondary to plans for Y-90 therapy. 2. Microcytic anemia. Question iron deficiency. Ferritin was normal on 04/20/2012. Hemoglobin stable on 08/10/2012. He is status post a red cell transfusion at [facility] on 09/09/2012. 3. Oxaliplatin neuropathy-improved 4. 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 5. Delayed nausea following cycle 2 FOLFIRI. Aloxi added beginning with cycle 3. 6. CEA 56.5 on 04/20/2012; 159 on 06/15/2012; 136 on 06/22/2012; 141 on 07/06/2012; 97.6 on 08/10/2012. 7. Skin infection over the left breast-status post a drainage procedure by Dr. Chilton Si. He completed a course of Augmentin. The wound has healed. He had  induration and a report of drainage at the right areola on 10/07/2012 8. Pain at right abdomen secondary to hepatomegaly/liver metastases. Currently not taking pain medication 9. Anorexia/weight loss-he started prednisone 09/21/2012, he discontinue the prednisone. I asked him to resume prednisone today. 10. Diarrhea-not clearly related to chemotherapy. He does not appear dehydrated. He will begin Imodium.   Disposition:  Mr. Guo has progressive metastatic colon cancer. Aflibercept remains on hold while interventional radiology tries to get approval for Y-90. We decided to continue infusional 5-FU. He will return for an office visit in 2 weeks. Mr. Danner will use oxycodone as needed for pain. He will begin Imodium for the diarrhea.   Thornton Papas, MD  10/21/2012  4:54 PM

## 2012-10-21 NOTE — Patient Instructions (Addendum)
[facility] Discharge Instructions for Patients Receiving Chemotherapy  Today you received the following chemotherapy agents 5FU.  To help prevent nausea and vomiting after your treatment, we encourage you to take your nausea medication.   If you develop nausea and vomiting that is not controlled by your nausea medication, call the clinic.   BELOW ARE SYMPTOMS THAT SHOULD BE REPORTED IMMEDIATELY:  *FEVER GREATER THAN 100.5 F  *CHILLS WITH OR WITHOUT FEVER  NAUSEA AND VOMITING THAT IS NOT CONTROLLED WITH YOUR NAUSEA MEDICATION  *UNUSUAL SHORTNESS OF BREATH  *UNUSUAL BRUISING OR BLEEDING  TENDERNESS IN MOUTH AND THROAT WITH OR WITHOUT PRESENCE OF ULCERS  *URINARY PROBLEMS  *BOWEL PROBLEMS  UNUSUAL RASH Items with * indicate a potential emergency and should be followed up as soon as possible.  Feel free to call the clinic you have any questions or concerns. The clinic phone number is [phone].    

## 2012-10-22 ENCOUNTER — Telehealth: Payer: Self-pay | Admitting: *Deleted

## 2012-10-22 ENCOUNTER — Telehealth: Payer: Self-pay | Admitting: Oncology

## 2012-10-22 NOTE — Telephone Encounter (Signed)
lvmf or pt regarding to June appt....mailed pt new appt sched and letter

## 2012-10-22 NOTE — Telephone Encounter (Signed)
Per staff message and POF I have scheduled appts.  JMW  

## 2012-10-23 ENCOUNTER — Ambulatory Visit (HOSPITAL_BASED_OUTPATIENT_CLINIC_OR_DEPARTMENT_OTHER): Payer: Medicaid Other

## 2012-10-23 VITALS — BP 127/74 | HR 83

## 2012-10-23 DIAGNOSIS — C787 Secondary malignant neoplasm of liver and intrahepatic bile duct: Secondary | ICD-10-CM

## 2012-10-23 DIAGNOSIS — C182 Malignant neoplasm of ascending colon: Secondary | ICD-10-CM

## 2012-10-23 DIAGNOSIS — Z452 Encounter for adjustment and management of vascular access device: Secondary | ICD-10-CM

## 2012-10-23 MED ORDER — SODIUM CHLORIDE 0.9 % IJ SOLN
10.0000 mL | INTRAMUSCULAR | Status: DC | PRN
  Administered 2012-10-23: 10 mL via INTRAVENOUS
  Filled 2012-10-23: qty 10

## 2012-10-23 MED ORDER — HEPARIN SOD (PORK) LOCK FLUSH 100 UNIT/ML IV SOLN
500.0000 [IU] | Freq: Once | INTRAVENOUS | Status: AC
  Administered 2012-10-23: 500 [IU] via INTRAVENOUS
  Filled 2012-10-23: qty 5

## 2012-10-25 ENCOUNTER — Telehealth: Payer: Self-pay | Admitting: *Deleted

## 2012-10-25 ENCOUNTER — Telehealth: Payer: Self-pay | Admitting: Emergency Medicine

## 2012-10-25 NOTE — Telephone Encounter (Signed)
Call from pt requesting copy of scans/MRI on disc to take to Dr. Laurell Josephs at [facility] for his Y90 evaluation. Called radiology to get discs ready. Instructed pt to pick up disc at [facility] radiology. He voiced understanding, stated he would pick up today.

## 2012-10-25 NOTE — Telephone Encounter (Signed)
CALLED PT TO MAKE HIM AWARE THAT [employer] WILL NOT PAY FOR THE TX PARTICLES HERE SO WE ARE REFERRING PT TO SEE DR Carmie End AT [facility].  THAT OFFICE WILL CONTACT PT TO SET UP CONSULT.  FAXING RECORDS TO [phone]

## 2012-10-28 ENCOUNTER — Encounter (HOSPITAL_COMMUNITY): Payer: Self-pay | Admitting: *Deleted

## 2012-10-28 ENCOUNTER — Emergency Department (HOSPITAL_COMMUNITY)
Admission: EM | Admit: 2012-10-28 | Discharge: 2012-10-29 | Disposition: A | Discharge status: Home / Self Care | Payer: Medicaid Other | Attending: Emergency Medicine | Admitting: Emergency Medicine

## 2012-10-28 ENCOUNTER — Emergency Department (HOSPITAL_COMMUNITY): Payer: Medicaid Other

## 2012-10-28 DIAGNOSIS — C189 Malignant neoplasm of colon, unspecified: Secondary | ICD-10-CM

## 2012-10-28 DIAGNOSIS — R109 Unspecified abdominal pain: Secondary | ICD-10-CM | POA: Insufficient documentation

## 2012-10-28 DIAGNOSIS — F419 Anxiety disorder, unspecified: Secondary | ICD-10-CM

## 2012-10-28 DIAGNOSIS — R630 Anorexia: Secondary | ICD-10-CM | POA: Insufficient documentation

## 2012-10-28 DIAGNOSIS — H269 Unspecified cataract: Secondary | ICD-10-CM | POA: Insufficient documentation

## 2012-10-28 DIAGNOSIS — I10 Essential (primary) hypertension: Secondary | ICD-10-CM | POA: Insufficient documentation

## 2012-10-28 DIAGNOSIS — F411 Generalized anxiety disorder: Secondary | ICD-10-CM | POA: Insufficient documentation

## 2012-10-28 DIAGNOSIS — Z79899 Other long term (current) drug therapy: Secondary | ICD-10-CM | POA: Insufficient documentation

## 2012-10-28 LAB — CBC WITH DIFFERENTIAL/PLATELET
HCT: 26.8 % — ABNORMAL LOW (ref 39.0–52.0)
Hemoglobin: 8.6 g/dL — ABNORMAL LOW (ref 13.0–17.0)
Lymphocytes Relative: 9 % — ABNORMAL LOW (ref 12–46)
MCV: 75.1 fL — ABNORMAL LOW (ref 78.0–100.0)
Monocytes Absolute: 1.9 10*3/uL — ABNORMAL HIGH (ref 0.1–1.0)
Monocytes Relative: 12 % (ref 3–12)
Neutro Abs: 13.3 10*3/uL — ABNORMAL HIGH (ref 1.7–7.7)
WBC: 16.8 10*3/uL — ABNORMAL HIGH (ref 4.0–10.5)

## 2012-10-28 LAB — URINALYSIS, ROUTINE W REFLEX MICROSCOPIC
Glucose, UA: NEGATIVE mg/dL
Ketones, ur: NEGATIVE mg/dL
Leukocytes, UA: NEGATIVE
Protein, ur: NEGATIVE mg/dL
Urobilinogen, UA: 2 mg/dL — ABNORMAL HIGH (ref 0.0–1.0)

## 2012-10-28 LAB — COMPREHENSIVE METABOLIC PANEL
BUN: 9 mg/dL (ref 6–23)
CO2: 23 mEq/L (ref 19–32)
Chloride: 91 mEq/L — ABNORMAL LOW (ref 96–112)
Creatinine, Ser: 1.02 mg/dL (ref 0.50–1.35)
GFR calc Af Amer: 88 mL/min — ABNORMAL LOW (ref >90)
GFR calc non Af Amer: 76 mL/min — ABNORMAL LOW (ref >90)
Glucose, Bld: 100 mg/dL — ABNORMAL HIGH (ref 70–99)
Total Bilirubin: 0.9 mg/dL (ref 0.3–1.2)

## 2012-10-28 MED ORDER — LORAZEPAM 2 MG/ML IJ SOLN
1.0000 mg | Freq: Once | INTRAMUSCULAR | Status: AC
  Administered 2012-10-28: 1 mg via INTRAVENOUS
  Filled 2012-10-28: qty 1

## 2012-10-28 MED ORDER — SODIUM CHLORIDE 0.9 % IV SOLN: Freq: Once | INTRAVENOUS | Status: DC

## 2012-10-28 MED ORDER — PROCHLORPERAZINE MALEATE 10 MG PO TABS
10.0000 mg | ORAL_TABLET | Freq: Once | ORAL | Status: AC
  Administered 2012-10-28: 10 mg via ORAL
  Filled 2012-10-28: qty 1

## 2012-10-28 MED ORDER — SODIUM CHLORIDE 0.9 % IV SOLN
Freq: Once | INTRAVENOUS | Status: AC
  Administered 2012-10-28: 22:00:00 via INTRAVENOUS

## 2012-10-28 NOTE — ED Notes (Signed)
Pt presents to ed with c/o generalized weakness, pt sts hasn't been able to eat for a week, and "I'm tired of sleeping". Pt sts extreme fatigue. Last chemo was done last Thursday. Pt is very irritable, sts he is in pain but doesn't want oxycodone or morphine for pain.

## 2012-10-28 NOTE — ED Provider Notes (Signed)
History     CSN: [id number]  Arrival date & time 10/28/12  2115   First MD Initiated Contact with Patient 10/28/12 2138      No chief complaint on file.   (Consider location/radiation/quality/duration/timing/severity/associated sxs/prior treatment) HPI  Nathan Robbins is a 67 y.o. male stage III colon cancer coming in complaining of generalized weakness, decreased appetite (patient has been very little last week) states that his pain medication is making him feel "crazy." He states he is having severe upper normal pain bilaterally. He denies fever, chest pain, shortness of breath, nausea vomiting, change in bowel or bladder habits.  Oncology Dr Myrle Sheng  Past Medical History  Diagnosis Date  . colon ca dx'd 2013  . Hypertension     History reviewed. No pertinent past surgical history.  History reviewed. No pertinent family history.  History  Substance Use Topics  . Smoking status: Never Smoker   . Smokeless tobacco: Never Used  . Alcohol Use: No      Review of Systems  Constitutional: Positive for appetite change. Negative for fever.  Respiratory: Negative for shortness of breath.   Cardiovascular: Negative for chest pain.  Gastrointestinal: Positive for abdominal pain. Negative for nausea, vomiting and diarrhea.  All other systems reviewed and are negative.    Allergies  Review of patient's allergies indicates no known allergies.  Home Medications   Current Outpatient Rx  Name  Route  Sig  Dispense  Refill  . diltiazem (TIAZAC) 360 MG 24 hr capsule   Oral   Take 360 mg by mouth every morning.          . docusate sodium (COLACE) 100 MG capsule   Oral   Take 100 mg by mouth 2 (two) times daily as needed for constipation.         Marland Kitchen FLUoxetine (PROZAC) 20 MG capsule   Oral   Take 20 mg by mouth every morning.         . loperamide (IMODIUM) 2 MG capsule   Oral   Take 2 mg by mouth 4 (four) times daily as needed for diarrhea or loose stools.          Marland Kitchen morphine (MS CONTIN) 15 MG 12 hr tablet   Oral   Take 1 tablet (15 mg total) by mouth 2 (two) times daily.   60 tablet   0   . ondansetron (ZOFRAN) 8 MG tablet   Oral   Take by mouth every 8 (eight) hours as needed for nausea.         Marland Kitchen oxycodone (OXY-IR) 5 MG capsule   Oral   Take 1-2 capsules (5-10 mg total) by mouth every 4 (four) hours as needed for pain.   100 capsule   0   . polyethylene glycol (MIRALAX / GLYCOLAX) packet   Oral   Take 17 g by mouth 2 (two) times daily as needed (constipation).         . predniSONE (DELTASONE) 10 MG tablet   Oral   Take 10 mg by mouth every morning. To stimulate appetite         . traZODone (DESYREL) 50 MG tablet   Oral   Take 1 tablet (50 mg total) by mouth at bedtime.   30 tablet   1     BP 171/92  Pulse 78  Temp(Src) 97.4 F (36.3 C) (Oral)  SpO2 100%  Physical Exam  Nursing note and vitals reviewed. Constitutional: He is oriented to person, place, and  time. He appears well-developed and well-nourished. No distress.  HENT:  Head: Normocephalic and atraumatic.  Mouth/Throat: Oropharynx is clear and moist.  Eyes: EOM are normal.  Cataract to left eye  Neck: Normal range of motion.  Cardiovascular: Normal rate, regular rhythm and intact distal pulses.   Pulmonary/Chest: Effort normal. No stridor. No respiratory distress. He has no wheezes. He has no rales. He exhibits no tenderness.  Port to left upper chest, no signs of infection  Abdominal: Soft. Bowel sounds are normal. He exhibits no distension and no mass. There is tenderness. There is no rebound.  Tender to light palpation of the bilateral upper quadrants with no guarding or rebound.  Musculoskeletal: Normal range of motion. He exhibits no edema.  Neurological: He is alert and oriented to person, place, and time.  Psychiatric: He has a normal mood and affect.    ED Course  Procedures (including critical care time)  Labs Reviewed  CBC WITH  DIFFERENTIAL - Abnormal; Notable for the following:    WBC 16.8 (*)    RBC 3.57 (*)    Hemoglobin 8.6 (*)    HCT 26.8 (*)    MCV 75.1 (*)    MCH 24.1 (*)    RDW 20.5 (*)    Platelets 556 (*)    Neutrophils Relative % 79 (*)    Neutro Abs 13.3 (*)    Lymphocytes Relative 9 (*)    Monocytes Absolute 1.9 (*)    All other components within normal limits  COMPREHENSIVE METABOLIC PANEL - Abnormal; Notable for the following:    Sodium 125 (*)    Chloride 91 (*)    Glucose, Bld 100 (*)    Albumin 2.4 (*)    AST 40 (*)    Alkaline Phosphatase 204 (*)    GFR calc non Af Amer 76 (*)    GFR calc Af Amer 88 (*)    All other components within normal limits  URINALYSIS, ROUTINE W REFLEX MICROSCOPIC - Abnormal; Notable for the following:    Urobilinogen, UA 2.0 (*)    All other components within normal limits   Dg Chest 2 View  10/28/2012   *RADIOLOGY REPORT*  Clinical Data: Weakness and history of metastatic colon carcinoma.  CHEST - 2 VIEW  Comparison: None.  Findings: A Port-A-Cath is present with the catheter tip in the SVC.  Lungs show no pulmonary infiltrates or nodules.  No pulmonary edema or pleural fluid is identified.  Cardiac and mediastinal contours are within normal limits.  No bony lesions or fractures are identified.  IMPRESSION: No active disease in the chest.   Original Report Authenticated By: Irish Lack, M.D.     1. Malignant neoplasm of colon, unspecified site   2. Decreased appetite   3. Anxiety       MDM   Filed Vitals:   10/28/12 2120  BP: 171/92  Pulse: 78  Temp: 97.4 F (36.3 C)  TempSrc: Oral  SpO2: 100%     Nathan Robbins is a 67 y.o. male stage III colon cancer complaining of generalized weakness, reduced by mouth intake. We have had an extensive discussion about depression. I've advised him he needs to discuss these things with his oncologist. Patient is very agitated and Ativan has calmed him down quite a bit.  Oncology consult from Dr. Shirline Frees  appreciated: He states it the patient will not need admission for the low level of anemia, he recommends that the patient followup as soon as possible with his  oncologist Dr. Myrle Sheng.  Medications  0.9 %  sodium chloride infusion ( Intravenous Rate/Dose Change 10/28/12 2323)  0.9 %  sodium chloride infusion ( Intravenous Stopped 10/28/12 2323)  LORazepam (ATIVAN) injection 1 mg (1 mg Intravenous Given 10/28/12 2255)  prochlorperazine (COMPAZINE) tablet 10 mg (10 mg Oral Given 10/28/12 2319)    The patient is hemodynamically stable, appropriate for, and amenable to, discharge at this time. Pt verbalized understanding and agrees with care plan. Outpatient follow-up and return precautions given.    Discharge Medication List as of 10/29/2012  1:17 AM    START taking these medications   Details  LORazepam (ATIVAN) 1 MG tablet Take 1 tablet (1 mg total) by mouth 3 (three) times daily as needed for anxiety., Starting 10/29/2012, Until Discontinued, [employer], PA-C 10/29/12 [phone]

## 2012-10-29 ENCOUNTER — Telehealth: Payer: Self-pay | Admitting: *Deleted

## 2012-10-29 MED ORDER — LORAZEPAM 1 MG PO TABS: 1.0000 mg | ORAL_TABLET | Freq: Three times a day (TID) | ORAL | Status: DC | PRN

## 2012-10-29 MED ORDER — HEPARIN SOD (PORK) LOCK FLUSH 100 UNIT/ML IV SOLN
INTRAVENOUS | Status: AC
  Administered 2012-10-29: 500 [IU] via INTRAVENOUS
  Filled 2012-10-29: qty 5

## 2012-10-29 MED ORDER — HEPARIN SOD (PORK) LOCK FLUSH 100 UNIT/ML IV SOLN: 500.0000 [IU] | Freq: Once | INTRAVENOUS | Status: AC

## 2012-10-29 NOTE — Telephone Encounter (Signed)
Called to report he was in emergency room last night-in a lot of pain and anxious. Reports he has been out of his Prozac for over a week. ER wrote refill on his Ativan. He reports feeling anxious and depressed-denies being suicidal. Suggested he talk with social worker, Lauren next week (left her message to call him) and he is very receptive to this. He is asking what he can do to get in shape for his chemo next week ? Told him to take it easy over the weekend. Eat small frequent high protein snacks and push fluids.  He reports he stopped his MS Contin and oxyir thinking it made his anxiety and agitation worse. He wants to see if Tylenol will help his pain. Told him Ok to try Tylenol, but call Monday if this does not help.

## 2012-11-02 ENCOUNTER — Ambulatory Visit: Payer: Medicaid Other | Admitting: Oncology

## 2012-11-03 ENCOUNTER — Other Ambulatory Visit: Payer: Self-pay | Admitting: Oncology

## 2012-11-04 ENCOUNTER — Other Ambulatory Visit (HOSPITAL_BASED_OUTPATIENT_CLINIC_OR_DEPARTMENT_OTHER): Payer: Medicaid Other | Admitting: Lab

## 2012-11-04 ENCOUNTER — Ambulatory Visit (HOSPITAL_BASED_OUTPATIENT_CLINIC_OR_DEPARTMENT_OTHER): Payer: Medicaid Other | Admitting: Oncology

## 2012-11-04 ENCOUNTER — Ambulatory Visit: Payer: Medicaid Other

## 2012-11-04 VITALS — BP 137/85 | HR 75 | Temp 97.7°F | Resp 18 | Ht 75.0 in | Wt 169.2 lb

## 2012-11-04 DIAGNOSIS — C189 Malignant neoplasm of colon, unspecified: Secondary | ICD-10-CM

## 2012-11-04 DIAGNOSIS — C182 Malignant neoplasm of ascending colon: Secondary | ICD-10-CM

## 2012-11-04 LAB — CBC WITH DIFFERENTIAL/PLATELET
BASO%: 0.6 % (ref 0.0–2.0)
EOS%: 0.1 % (ref 0.0–7.0)
HCT: 26.9 % — ABNORMAL LOW (ref 38.4–49.9)
LYMPH%: 11.6 % — ABNORMAL LOW (ref 14.0–49.0)
MCH: 25.1 pg — ABNORMAL LOW (ref 27.2–33.4)
MCHC: 32.3 g/dL (ref 32.0–36.0)
NEUT%: 80.3 % — ABNORMAL HIGH (ref 39.0–75.0)
lymph#: 2.4 10*3/uL (ref 0.9–3.3)

## 2012-11-04 LAB — COMPREHENSIVE METABOLIC PANEL
AST: 49 U/L — ABNORMAL HIGH (ref 0–37)
Alkaline Phosphatase: 199 U/L — ABNORMAL HIGH (ref 39–117)
BUN: 15 mg/dL (ref 6–23)
Creatinine, Ser: 1.03 mg/dL (ref 0.50–1.35)
Potassium: 4.8 mEq/L (ref 3.5–5.3)

## 2012-11-04 NOTE — Progress Notes (Signed)
Has appointment at [facility] Interventional Radiology 11/05/12 at 10 am for consultation regarding Y-90 treatment. Reporting progression of abdominal pain and swelling-not taking pain meds saying "they make me crazier". Says he has not been eating for last several days-only drinking water. Has stopped taking most of his medications. Lying on exam table on his right side with shirt & belt unbuttoned and hat pulled over his eyes. His sister is present for appointment today.

## 2012-11-04 NOTE — Progress Notes (Signed)
[facility]    OFFICE PROGRESS NOTE   INTERVAL HISTORY:   He returns as scheduled. He completed another cycle of infusional 5-FU on 10/21/2012. Nathan Robbins was seen in the emergency room on 10/28/2012 with anxiety. He was prescribed Ativan.  Nathan Robbins has multiple complaints today. His chief complaint is abdominal pain. He did not feel well when taking morphine. He is taking no pain medication at present. He reports diarrhea for the past few days. He has malaise. He stays in bed for the majority of the day. Nathan Robbins feels "unsteady "on his feet and requests a walker.  He is scheduled for an appointment at [facility] on 11/05/2012 to consider radio embolization.  Objective:  Vital signs in last 24 hours:  Blood pressure 137/85, pulse 75, temperature 97.7 F (36.5 C), temperature source Oral, resp. rate 18, height 6\' 3"  (1.905 m), weight 169 lb 3.2 oz (76.749 kg).    HEENT: No thrush or ulcers, chronic discoloration at the left cornea Resp: Lungs clear bilaterally Cardio: Regular rate and rhythm GI: Markedly hepatomegaly extending across the midline Vascular: No leg edema Neuro: Alert and oriented    Portacath/PICC-without erythema  Lab Results:  Lab Results  Component Value Date   WBC 20.3* 11/04/2012   HGB 8.7* 11/04/2012   HCT 26.9* 11/04/2012   MCV 77.6* 11/04/2012   PLT 472* 11/04/2012   ANC 16.3    Medications: I have reviewed the patient's current medications.  Assessment/Plan: 1. Metastatic colon cancer diagnosed in January 2013, right colon mass (T3 N1), status post right colectomy and biopsy of a metastatic liver lesion. Status post 11 cycles of FOLFOX/Avastin with restaging CT on 01/15/2012 revealing stable metastatic disease involving the liver. Chemotherapy switched to weekly 5-FU/leucovorin and every 2 week Avastin beginning on 02/18/2012; last treated with 5-FU/leucovorin on 03/24/2012 and last treated with Avastin on 03/31/2012. Restaging CT  evaluation 04/23/2012 showed increased hepatic metastatic disease. Cycle 1 of FOLFIRI chemotherapy 05/04/2012; cycle 2 05/26/2012; cycle 3 06/08/2012; cycle 4 06/22/2012. Cycle 5 on 07/06/2012; cycle 6 on 07/27/2012. Restaging CT abdomen 08/06/2012 showed progression of multiple hepatic metastases. -Initiation of infusional 5-FU and Aflibercept on 09/23/2012 . Aflibercept was placed on hold after the first cycle secondary to plans for Y-90 therapy.  2. Microcytic anemia. Question iron deficiency. Ferritin was normal on 04/20/2012. Marland Kitchen He is status post a red cell transfusion at [facility] on 09/09/2012. Hemoglobin stable today. 3. Oxaliplatin neuropathy-improved 4. 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 5. Delayed nausea following cycle 2 FOLFIRI. Aloxi added beginning with cycle 3. 6. CEA 56.5 on 04/20/2012; 159 on 06/15/2012; 136 on 06/22/2012; 141 on 07/06/2012; 97.6 on 08/10/2012. 7. Skin infection over the left breast-status post a drainage procedure by Dr. Chilton Si. He completed a course of Augmentin. The wound has healed. He had induration and a report of drainage at the right areola on 10/07/2012 8. Pain at right abdomen secondary to hepatomegaly/liver metastases. Currently not taking pain medication 9. Anorexia/weight loss-he started prednisone 09/21/2012, he discontinue the prednisone. . 10. Intermittent diarrhea 11. Insomnia-he will try Ativan  Disposition:  Nathan Robbins has advanced metastatic colon cancer. His performance status has declined over the past several weeks. We discussed the risks and benefits of further systemic therapy. I do not recommend further chemotherapy. He agrees to a [facility] referral. We discussed CPR and ACLS issues. He will be placed on a no CODE BLUE status.  Nathan Robbins will try oxycodone for pain.  If this does not help he will contact us. He will try Ativan for insomnia. We will ask the [facility] hospice program to see him within  the next few days. He would like to attend the scheduled interventional radiology appointment at [facility] on 11/05/2012.  Nathan Robbins will return for an office visit here in one week. He will contact us if the pain is not relieved with oxycodone.   Thornton Papas, MD  11/04/2012  4:45 PM

## 2012-11-04 NOTE — Progress Notes (Addendum)
[facility] referral faxed to Referral Center [phone] with request to see patient asap.

## 2012-11-04 NOTE — Addendum Note (Signed)
Addended by: Wandalee Ferdinand on: 11/04/2012 05:14 PM   Modules accepted: Orders

## 2012-11-05 ENCOUNTER — Telehealth: Payer: Self-pay | Admitting: Oncology

## 2012-11-05 NOTE — Telephone Encounter (Signed)
lvm for pt regarding to 6.25.14 appt.Marland KitchenMarland KitchenMarland Kitchen

## 2012-11-05 NOTE — Progress Notes (Signed)
Faxed labs to [facility] interventional radiology department fax #[phone]

## 2012-11-08 ENCOUNTER — Encounter: Payer: Self-pay | Admitting: *Deleted

## 2012-11-08 NOTE — Progress Notes (Signed)
An attempt was made to reach Mr. Giangregorio to assess for needs, but phone contact was not made. A voice mail message was left.  Gretta Cool, Marine scientist [phone]

## 2012-11-08 NOTE — ED Provider Notes (Signed)
Medical screening examination/treatment/procedure(s) were conducted as a shared visit with non-physician practitioner(s) and myself.  I personally evaluated the patient during the encounter   Rolan Bucco, MD 11/08/12 1725

## 2012-11-10 ENCOUNTER — Ambulatory Visit (HOSPITAL_BASED_OUTPATIENT_CLINIC_OR_DEPARTMENT_OTHER): Payer: Medicaid Other | Admitting: Nurse Practitioner

## 2012-11-10 ENCOUNTER — Telehealth: Payer: Self-pay | Admitting: Oncology

## 2012-11-10 VITALS — BP 122/77 | HR 80 | Temp 96.6°F | Resp 18 | Ht 75.0 in | Wt 172.6 lb

## 2012-11-10 DIAGNOSIS — C189 Malignant neoplasm of colon, unspecified: Secondary | ICD-10-CM

## 2012-11-10 MED ORDER — ONDANSETRON HCL 8 MG PO TABS: 8.0000 mg | ORAL_TABLET | Freq: Three times a day (TID) | ORAL | Status: AC | PRN

## 2012-11-10 MED ORDER — DILTIAZEM HCL ER BEADS 360 MG PO CP24: 360.0000 mg | ORAL_CAPSULE | Freq: Every morning | ORAL | Status: AC

## 2012-11-10 MED ORDER — OXYCODONE HCL 5 MG PO CAPS: 5.0000 mg | ORAL_CAPSULE | ORAL | Status: DC | PRN

## 2012-11-10 NOTE — Telephone Encounter (Signed)
Gave pt appt for MD on 7/10

## 2012-11-10 NOTE — Progress Notes (Signed)
OFFICE PROGRESS NOTE  Interval history:  Nathan Robbins returns as scheduled. He reports improved pain control with oxycodone. He estimates taking the oxycodone every 4 hours. No nausea or vomiting. Bowels moving. His sister notes that his urine is dark.  He reports having an appointment at [facility] on 11/17/2012 for radio embolization planning.   Objective: Blood pressure 122/77, pulse 80, temperature 96.6 F (35.9 C), temperature source Oral, resp. rate 18, height 6\' 3"  (1.905 m), weight 172 lb 9.6 oz (78.291 kg).  Sclera anicteric. Chronic discoloration at the left cornea. Oropharynx without thrush. Lungs clear. Regular cardiac rhythm. Marked hepatomegaly extending across midline. Pitting lower leg edema bilaterally. Alert. Question mild confusion. Motor strength 5 over 5. Knee DTRs 2+, symmetric.  Lab Results: Lab Results  Component Value Date   WBC 20.3* 11/04/2012   HGB 8.7* 11/04/2012   HCT 26.9* 11/04/2012   MCV 77.6* 11/04/2012   PLT 472* 11/04/2012    Chemistry:    Chemistry      Component Value Date/Time   NA 122* 11/04/2012 1527   NA 127* 10/21/2012 1255   K 4.8 11/04/2012 1527   K 3.9 10/21/2012 1255   CL 88* 11/04/2012 1527   CL 96* 10/21/2012 1255   CO2 20 11/04/2012 1527   CO2 23 10/21/2012 1255   BUN 15 11/04/2012 1527   BUN 10.3 10/21/2012 1255   CREATININE 1.03 11/04/2012 1527   CREATININE 1.0 10/21/2012 1255      Component Value Date/Time   CALCIUM 8.8 11/04/2012 1527   CALCIUM 8.5 10/21/2012 1255   ALKPHOS 199* 11/04/2012 1527   ALKPHOS 203* 10/21/2012 1255   AST 49* 11/04/2012 1527   AST 42* 10/21/2012 1255   ALT 14 11/04/2012 1527   ALT 17 10/21/2012 1255   BILITOT 1.8* 11/04/2012 1527   BILITOT 1.26* 10/21/2012 1255       Studies/Results: Dg Chest 2 View  10/28/2012   *RADIOLOGY REPORT*  Clinical Data: Weakness and history of metastatic colon carcinoma.  CHEST - 2 VIEW  Comparison: None.  Findings: A Port-A-Cath is present with the catheter tip in the SVC.  Lungs show no pulmonary  infiltrates or nodules.  No pulmonary edema or pleural fluid is identified.  Cardiac and mediastinal contours are within normal limits.  No bony lesions or fractures are identified.  IMPRESSION: No active disease in the chest.   Original Report Authenticated By: Irish Lack, M.D.    Medications: I have reviewed the patient's current medications.  Assessment/Plan:  1. Metastatic colon cancer diagnosed in January 2013, right colon mass (T3 N1), status post right colectomy and biopsy of a metastatic liver lesion. Status post 11 cycles of FOLFOX/Avastin with restaging CT on 01/15/2012 revealing stable metastatic disease involving the liver. Chemotherapy switched to weekly 5-FU/leucovorin and every 2 week Avastin beginning on 02/18/2012; last treated with 5-FU/leucovorin on 03/24/2012 and last treated with Avastin on 03/31/2012. Restaging CT evaluation 04/23/2012 showed increased hepatic metastatic disease. Cycle 1 of FOLFIRI chemotherapy 05/04/2012; cycle 2 05/26/2012; cycle 3 06/08/2012; cycle 4 06/22/2012. Cycle 5 on 07/06/2012; cycle 6 on 07/27/2012. Restaging CT abdomen 08/06/2012 showed progression of multiple hepatic metastases. Initiation of infusional 5-FU and Aflibercept on 09/23/2012 . Aflibercept was placed on hold after the first cycle secondary to plans for Y-90 therapy.  2. Microcytic anemia. Question iron deficiency. Ferritin was normal on 04/20/2012. He is status post a red cell transfusion at [facility] on 09/09/2012. 3. Oxaliplatin neuropathy-improved. 4. 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 5. Delayed nausea following cycle 2 FOLFIRI. Aloxi added beginning with cycle 3. 6. CEA 56.5 on 04/20/2012; 159 on 06/15/2012; 136 on 06/22/2012; 141 on 07/06/2012; 97.6 on 08/10/2012. 7. Skin infection over the left breast-status post a drainage procedure by Dr. Chilton Si. He completed a course of Augmentin. The wound has healed. He had induration and a report of drainage  at the right areola on 10/07/2012 8. Pain at right abdomen secondary to hepatomegaly/liver metastases. Pain is controlled with oxycodone. 9. Anorexia/weight loss-he started prednisone 09/21/2012. 10. Intermittent diarrhea. 11. Insomnia. He takes Ativan as needed.  Disposition-Nathan Robbins continues to have a poor performance status. He notes improved pain control with oxycodone. He is scheduled to return to [facility] on 11/17/2012 for radio embolization planning. He cannot enroll in the hospice program while undergoing radioembolization. If he is not felt to be a candidate for radio embolization he and/or his sisters will contact the hospice representative that recently met with them at their home to initiate services.  He will return for a followup visit in approximately 2 weeks. He requested a new prescription for oxycodone at today's visit.  Plan reviewed with Dr. Truett Perna.  Lonna Cobb ANP/GNP-BC

## 2012-11-12 ENCOUNTER — Other Ambulatory Visit: Payer: Self-pay | Admitting: *Deleted

## 2012-11-12 ENCOUNTER — Telehealth: Payer: Self-pay | Admitting: *Deleted

## 2012-11-12 ENCOUNTER — Telehealth: Payer: Self-pay | Admitting: Dietician

## 2012-11-12 DIAGNOSIS — C189 Malignant neoplasm of colon, unspecified: Secondary | ICD-10-CM

## 2012-11-12 MED ORDER — TRAMADOL HCL 50 MG PO TABS: ORAL_TABLET | ORAL | Status: DC

## 2012-11-12 NOTE — Telephone Encounter (Signed)
Patient called asking for something for his pain that will not make him sleep.  He has been "asleep for two days.  I feel terrible with very serious pain that is continuous, very sharp and on both sides of my abdomen.  Initially says he can't pick up script then says his sister Collene Gobble can come and pick up a prescription.  Has used morphine and Oxy-IR and they both make him sleep.  Will notify providers.  Can rach him at [phone].

## 2012-11-12 NOTE — Telephone Encounter (Signed)
Verbal order received and read back from dr. Truett Perna for tramadol 50 mg, take 1/2 to 1 tablet q 6 hrs as needed for pain for patient to use instead of the OXY- IR 5 mg tabs.  Called patient, reviewed the instructions for use.  Informed him if this doesn't work that he'll need to go back to using the Oxy-IR.  Also informed him that pain meds affect the brain and will make people drowsy and sleep.  He asked me for clarification if he could use the first medication he was given and I explained to use the new medicine instead of the order from last weeks visit but if it doesn't work to resme the oxy-IR.

## 2012-11-12 NOTE — Telephone Encounter (Signed)
Brief Outpatient Oncology Nutrition Note  Patient has been identified to be at risk on malnutrition screen.  Wt Readings from Last 10 Encounters:  11/10/12 172 lb 9.6 oz (78.291 kg)  11/04/12 169 lb 3.2 oz (76.749 kg)  10/21/12 172 lb 12.8 oz (78.382 kg)  10/07/12 172 lb 12.8 oz (78.382 kg)  10/05/12 185 lb (83.915 kg)  09/23/12 182 lb 1.6 oz (82.6 kg)  09/21/12 171 lb 11.2 oz (77.883 kg)  09/07/12 184 lb 1.6 oz (83.507 kg)  08/10/12 187 lb 9.6 oz (85.095 kg)  08/03/12 188 lb 3.2 oz (85.367 kg)   Patient with colon cancer, for appointment at [facility] 11/17/12 for radio embolization planning. If patient is not a candidate, hospice wile be initiated.  Called patient and left a message with Outpatient Cancer Cancer RD contact information.  Oran Rein, RD, LDN

## 2012-11-13 ENCOUNTER — Inpatient Hospital Stay (HOSPITAL_COMMUNITY): Payer: Medicaid Other

## 2012-11-13 ENCOUNTER — Emergency Department (HOSPITAL_COMMUNITY): Payer: Medicaid Other

## 2012-11-13 ENCOUNTER — Encounter (HOSPITAL_COMMUNITY): Payer: Self-pay | Admitting: Emergency Medicine

## 2012-11-13 ENCOUNTER — Inpatient Hospital Stay (HOSPITAL_COMMUNITY)
Admission: EM | Admit: 2012-11-13 | Discharge: 2012-11-17 | DRG: 375 | Disposition: A | Discharge status: Hospice | Payer: Medicaid Other | Attending: Internal Medicine | Admitting: Internal Medicine

## 2012-11-13 ENCOUNTER — Other Ambulatory Visit: Payer: Self-pay

## 2012-11-13 DIAGNOSIS — F411 Generalized anxiety disorder: Secondary | ICD-10-CM | POA: Diagnosis present

## 2012-11-13 DIAGNOSIS — C189 Malignant neoplasm of colon, unspecified: Principal | ICD-10-CM | POA: Diagnosis present

## 2012-11-13 DIAGNOSIS — R188 Other ascites: Secondary | ICD-10-CM | POA: Diagnosis present

## 2012-11-13 DIAGNOSIS — C786 Secondary malignant neoplasm of retroperitoneum and peritoneum: Secondary | ICD-10-CM | POA: Diagnosis present

## 2012-11-13 DIAGNOSIS — R634 Abnormal weight loss: Secondary | ICD-10-CM | POA: Diagnosis present

## 2012-11-13 DIAGNOSIS — D509 Iron deficiency anemia, unspecified: Secondary | ICD-10-CM | POA: Diagnosis present

## 2012-11-13 DIAGNOSIS — D72829 Elevated white blood cell count, unspecified: Secondary | ICD-10-CM | POA: Diagnosis present

## 2012-11-13 DIAGNOSIS — D638 Anemia in other chronic diseases classified elsewhere: Secondary | ICD-10-CM

## 2012-11-13 DIAGNOSIS — C787 Secondary malignant neoplasm of liver and intrahepatic bile duct: Secondary | ICD-10-CM | POA: Diagnosis present

## 2012-11-13 DIAGNOSIS — C799 Secondary malignant neoplasm of unspecified site: Secondary | ICD-10-CM

## 2012-11-13 DIAGNOSIS — Z515 Encounter for palliative care: Secondary | ICD-10-CM

## 2012-11-13 DIAGNOSIS — IMO0002 Reserved for concepts with insufficient information to code with codable children: Secondary | ICD-10-CM

## 2012-11-13 DIAGNOSIS — R5381 Other malaise: Secondary | ICD-10-CM | POA: Diagnosis present

## 2012-11-13 DIAGNOSIS — R627 Adult failure to thrive: Secondary | ICD-10-CM | POA: Diagnosis present

## 2012-11-13 DIAGNOSIS — R109 Unspecified abdominal pain: Secondary | ICD-10-CM | POA: Diagnosis present

## 2012-11-13 DIAGNOSIS — I10 Essential (primary) hypertension: Secondary | ICD-10-CM | POA: Diagnosis present

## 2012-11-13 DIAGNOSIS — E871 Hypo-osmolality and hyponatremia: Secondary | ICD-10-CM | POA: Diagnosis present

## 2012-11-13 DIAGNOSIS — G893 Neoplasm related pain (acute) (chronic): Secondary | ICD-10-CM | POA: Diagnosis present

## 2012-11-13 DIAGNOSIS — E236 Other disorders of pituitary gland: Secondary | ICD-10-CM | POA: Diagnosis present

## 2012-11-13 DIAGNOSIS — R531 Weakness: Secondary | ICD-10-CM

## 2012-11-13 DIAGNOSIS — Z66 Do not resuscitate: Secondary | ICD-10-CM | POA: Diagnosis present

## 2012-11-13 DIAGNOSIS — K5900 Constipation, unspecified: Secondary | ICD-10-CM | POA: Diagnosis present

## 2012-11-13 LAB — URINALYSIS, ROUTINE W REFLEX MICROSCOPIC
Hgb urine dipstick: NEGATIVE
Ketones, ur: 15 mg/dL — AB
Protein, ur: NEGATIVE mg/dL
Urobilinogen, UA: 4 mg/dL — ABNORMAL HIGH (ref 0.0–1.0)

## 2012-11-13 LAB — COMPREHENSIVE METABOLIC PANEL
ALT: 19 U/L (ref 0–53)
CO2: 19 mEq/L (ref 19–32)
Calcium: 9.2 mg/dL (ref 8.4–10.5)
Creatinine, Ser: 0.96 mg/dL (ref 0.50–1.35)
GFR calc Af Amer: >90 mL/min (ref >90)
GFR calc non Af Amer: 86 mL/min — ABNORMAL LOW (ref >90)
Glucose, Bld: 66 mg/dL — ABNORMAL LOW (ref 70–99)
Sodium: 120 mEq/L — ABNORMAL LOW (ref 135–145)
Total Bilirubin: 3 mg/dL — ABNORMAL HIGH (ref 0.3–1.2)

## 2012-11-13 LAB — CBC WITH DIFFERENTIAL/PLATELET
Basophils Relative: 0 % (ref 0–1)
Eosinophils Absolute: 0 10*3/uL (ref 0.0–0.7)
HCT: 26.4 % — ABNORMAL LOW (ref 39.0–52.0)
Hemoglobin: 8.9 g/dL — ABNORMAL LOW (ref 13.0–17.0)
MCH: 25 pg — ABNORMAL LOW (ref 26.0–34.0)
MCHC: 33.7 g/dL (ref 30.0–36.0)
Monocytes Absolute: 3.4 10*3/uL — ABNORMAL HIGH (ref 0.1–1.0)
Neutro Abs: 26.1 10*3/uL — ABNORMAL HIGH (ref 1.7–7.7)

## 2012-11-13 LAB — URINE MICROSCOPIC-ADD ON

## 2012-11-13 MED ORDER — HYDROMORPHONE HCL PF 1 MG/ML IJ SOLN
1.0000 mg | Freq: Once | INTRAMUSCULAR | Status: AC
  Administered 2012-11-13: 1 mg via INTRAVENOUS
  Filled 2012-11-13: qty 1

## 2012-11-13 MED ORDER — DEXTROSE 50 % IV SOLN
1.0000 | Freq: Once | INTRAVENOUS | Status: AC
  Administered 2012-11-13: 50 mL via INTRAVENOUS
  Filled 2012-11-13: qty 50

## 2012-11-13 MED ORDER — ONDANSETRON HCL 4 MG/2ML IJ SOLN: 4.0000 mg | Freq: Three times a day (TID) | INTRAMUSCULAR | Status: DC | PRN

## 2012-11-13 MED ORDER — LORAZEPAM 1 MG PO TABS
1.0000 mg | ORAL_TABLET | Freq: Three times a day (TID) | ORAL | Status: DC | PRN
  Administered 2012-11-15 – 2012-11-16 (×2): 1 mg via ORAL
  Filled 2012-11-13 (×3): qty 1

## 2012-11-13 MED ORDER — PREDNISONE 10 MG PO TABS
10.0000 mg | ORAL_TABLET | Freq: Every morning | ORAL | Status: DC
  Administered 2012-11-14 – 2012-11-17 (×4): 10 mg via ORAL
  Filled 2012-11-13 (×4): qty 1

## 2012-11-13 MED ORDER — ENOXAPARIN SODIUM 40 MG/0.4ML ~~LOC~~ SOLN
40.0000 mg | Freq: Every day | SUBCUTANEOUS | Status: DC
  Administered 2012-11-14 – 2012-11-17 (×4): 40 mg via SUBCUTANEOUS
  Filled 2012-11-13 (×4): qty 0.4

## 2012-11-13 MED ORDER — SODIUM CHLORIDE 0.9 % IV SOLN: INTRAVENOUS | Status: AC

## 2012-11-13 MED ORDER — HYDROCODONE-ACETAMINOPHEN 5-325 MG PO TABS
1.0000 | ORAL_TABLET | ORAL | Status: DC | PRN
  Administered 2012-11-14 – 2012-11-16 (×5): 2 via ORAL
  Filled 2012-11-13 (×5): qty 2

## 2012-11-13 MED ORDER — ONDANSETRON HCL 4 MG/2ML IJ SOLN
4.0000 mg | Freq: Once | INTRAMUSCULAR | Status: AC
  Administered 2012-11-13: 4 mg via INTRAVENOUS
  Filled 2012-11-13: qty 2

## 2012-11-13 MED ORDER — IOHEXOL 300 MG/ML  SOLN
50.0000 mL | Freq: Once | INTRAMUSCULAR | Status: AC | PRN
  Administered 2012-11-13: 50 mL via ORAL

## 2012-11-13 MED ORDER — SODIUM CHLORIDE 0.9 % IV SOLN
INTRAVENOUS | Status: DC
  Administered 2012-11-13 – 2012-11-14 (×2): via INTRAVENOUS

## 2012-11-13 MED ORDER — SODIUM CHLORIDE 0.9 % IV BOLUS (SEPSIS)
1000.0000 mL | Freq: Once | INTRAVENOUS | Status: AC
  Administered 2012-11-13: 1000 mL via INTRAVENOUS

## 2012-11-13 MED ORDER — SENNA 8.6 MG PO TABS
1.0000 | ORAL_TABLET | Freq: Two times a day (BID) | ORAL | Status: DC
  Administered 2012-11-16 – 2012-11-17 (×2): 8.6 mg via ORAL
  Filled 2012-11-13 (×6): qty 1

## 2012-11-13 MED ORDER — ENOXAPARIN SODIUM 40 MG/0.4ML ~~LOC~~ SOLN
40.0000 mg | Freq: Every day | SUBCUTANEOUS | Status: DC
  Filled 2012-11-13: qty 0.4

## 2012-11-13 MED ORDER — ONDANSETRON HCL 4 MG PO TABS: 4.0000 mg | ORAL_TABLET | Freq: Four times a day (QID) | ORAL | Status: DC | PRN

## 2012-11-13 MED ORDER — DILTIAZEM HCL ER BEADS 240 MG PO CP24
360.0000 mg | ORAL_CAPSULE | Freq: Every morning | ORAL | Status: DC
  Administered 2012-11-14 – 2012-11-17 (×4): 360 mg via ORAL
  Filled 2012-11-13 (×4): qty 1

## 2012-11-13 MED ORDER — ACETAMINOPHEN 325 MG PO TABS: 650.0000 mg | ORAL_TABLET | Freq: Four times a day (QID) | ORAL | Status: DC | PRN

## 2012-11-13 MED ORDER — DOCUSATE SODIUM 100 MG PO CAPS
100.0000 mg | ORAL_CAPSULE | Freq: Two times a day (BID) | ORAL | Status: DC
  Administered 2012-11-16 – 2012-11-17 (×3): 100 mg via ORAL
  Filled 2012-11-13 (×9): qty 1

## 2012-11-13 MED ORDER — HYDROMORPHONE HCL PF 1 MG/ML IJ SOLN
1.0000 mg | INTRAMUSCULAR | Status: DC | PRN
  Administered 2012-11-13 – 2012-11-16 (×8): 1 mg via INTRAVENOUS
  Filled 2012-11-13 (×9): qty 1

## 2012-11-13 MED ORDER — POLYETHYLENE GLYCOL 3350 17 G PO PACK
17.0000 g | PACK | Freq: Two times a day (BID) | ORAL | Status: DC | PRN
  Filled 2012-11-13: qty 1

## 2012-11-13 MED ORDER — IOHEXOL 300 MG/ML  SOLN
100.0000 mL | Freq: Once | INTRAMUSCULAR | Status: AC | PRN
  Administered 2012-11-13: 100 mL via INTRAVENOUS

## 2012-11-13 MED ORDER — FLUOXETINE HCL 20 MG PO CAPS
20.0000 mg | ORAL_CAPSULE | Freq: Every morning | ORAL | Status: DC
Start: 2012-11-14 — End: 2012-11-17
  Administered 2012-11-14 – 2012-11-17 (×4): 20 mg via ORAL
  Filled 2012-11-13 (×4): qty 1

## 2012-11-13 MED ORDER — HYDROMORPHONE HCL PF 1 MG/ML IJ SOLN: 1.0000 mg | INTRAMUSCULAR | Status: DC | PRN

## 2012-11-13 MED ORDER — ONDANSETRON HCL 4 MG/2ML IJ SOLN
4.0000 mg | Freq: Four times a day (QID) | INTRAMUSCULAR | Status: DC | PRN
  Administered 2012-11-14 (×2): 4 mg via INTRAVENOUS
  Filled 2012-11-13 (×2): qty 2

## 2012-11-13 MED ORDER — ACETAMINOPHEN 650 MG RE SUPP: 650.0000 mg | Freq: Four times a day (QID) | RECTAL | Status: DC | PRN

## 2012-11-13 NOTE — ED Notes (Signed)
Removed ice chips from bedside per NPO order

## 2012-11-13 NOTE — ED Notes (Signed)
Bed:WA02<BR> Expected date:<BR> Expected time:<BR> Means of arrival:<BR> Comments:<BR>

## 2012-11-13 NOTE — ED Notes (Signed)
Pt c/o "liver pain" x 3 days.  Has stage 3 hepatic cancer.  Denies NVD.  Family states he has not been eating x 3 days.  Not on chemo or radiation.

## 2012-11-13 NOTE — ED Provider Notes (Signed)
History    CSN: [id number] Arrival date & time 11/13/12  1440  First MD Initiated Contact with Patient 11/13/12 1502     Chief Complaint  Patient presents with  . Abdominal Pain  . Hepatic Cancer   (Consider location/radiation/quality/duration/timing/severity/associated sxs/prior Treatment) Patient is a 67 y.o. male presenting with abdominal pain. The history is provided by the patient and a caregiver.  Abdominal Pain This is a chronic problem. The current episode started more than 1 week ago. The problem occurs constantly. The problem has been gradually worsening. Associated symptoms include abdominal pain and shortness of breath. Pertinent negatives include no chest pain. Associated symptoms comments: Hx of stage 3 colon cancer with liver mets not ammenable to treatment.  Seen multiple times in ER this month for pain and states oxycodone and morphine too strong and now on tramadol but not helping with the pain.  Pt has not eaten or drank anything in 3 days and now is c/o of being so weak he now falls when trying to go to the bathroom.  No fever, cough, N/V/D.  States extreme SOB with any activity.  Unable to catch his breath when walking across the room. The symptoms are aggravated by eating and walking. Nothing relieves the symptoms. Treatments tried: tries tramadol. The treatment provided no relief.   Past Medical History  Diagnosis Date  . colon ca dx'd 2013  . Hypertension    History reviewed. No pertinent past surgical history. History reviewed. No pertinent family history. History  Substance Use Topics  . Smoking status: Never Smoker   . Smokeless tobacco: Never Used  . Alcohol Use: No    Review of Systems  Constitutional: Positive for fatigue. Negative for fever.  Respiratory: Positive for shortness of breath. Negative for cough.   Cardiovascular: Positive for leg swelling. Negative for chest pain.       Leg swelling per family for the last 1 week  Gastrointestinal:  Positive for abdominal pain. Negative for nausea, vomiting and diarrhea.  Genitourinary: Negative for dysuria.  Neurological: Positive for weakness.  All other systems reviewed and are negative.    Allergies  Review of patient's allergies indicates no known allergies.  Home Medications   Current Outpatient Rx  Name  Route  Sig  Dispense  Refill  . diltiazem (TIAZAC) 360 MG 24 hr capsule   Oral   Take 1 capsule (360 mg total) by mouth every morning.   30 capsule   1   . FLUoxetine (PROZAC) 20 MG capsule   Oral   Take 20 mg by mouth every morning.         . loperamide (IMODIUM) 2 MG capsule   Oral   Take 2 mg by mouth 4 (four) times daily as needed for diarrhea or loose stools.         Marland Kitchen LORazepam (ATIVAN) 1 MG tablet   Oral   Take 1 tablet (1 mg total) by mouth 3 (three) times daily as needed for anxiety.   15 tablet   0   . ondansetron (ZOFRAN) 8 MG tablet   Oral   Take 1 tablet (8 mg total) by mouth every 8 (eight) hours as needed for nausea.   20 tablet   1   . polyethylene glycol (MIRALAX / GLYCOLAX) packet   Oral   Take 17 g by mouth 2 (two) times daily as needed (constipation).         . predniSONE (DELTASONE) 10 MG tablet   Oral  Take 10 mg by mouth every morning. To stimulate appetite         . traMADol (ULTRAM) 50 MG tablet      Take 1/2 to 1 tablet by mouth q 6 hrs prn pain   60 tablet   0    BP 159/96  Pulse 98  Temp(Src) 97.6 F (36.4 C) (Oral)  Resp 20  SpO2 98% Physical Exam  Nursing note and vitals reviewed. Constitutional: He is oriented to person, place, and time. He appears well-developed. He appears cachectic. No distress.  HENT:  Head: Normocephalic and atraumatic.  Mouth/Throat: Oropharynx is clear and moist and mucous membranes are normal.  Eyes: Conjunctivae and EOM are normal. Pupils are equal, round, and reactive to light. No scleral icterus.  Neck: Normal range of motion. Neck supple.  Cardiovascular: Normal rate,  regular rhythm and intact distal pulses.   Murmur heard. Pulmonary/Chest: Effort normal and breath sounds normal. No respiratory distress. He has no wheezes. He has no rales.  Abdominal: Soft. He exhibits no distension, no fluid wave and no ascites. There is hepatomegaly. There is tenderness. There is guarding. There is no rigidity and no rebound.    Well healed midline surgical scar  Musculoskeletal: Normal range of motion. He exhibits edema. He exhibits no tenderness.  2+ edema in the lower ext  Neurological: He is alert and oriented to person, place, and time.  Skin: Skin is warm and dry. No rash noted. No erythema. There is pallor.  No jaundice  Psychiatric: He has a normal mood and affect. His behavior is normal.    ED Course  Procedures (including critical care time) Labs Reviewed  CBC WITH DIFFERENTIAL - Abnormal; Notable for the following:    WBC 30.7 (*)    RBC 3.56 (*)    Hemoglobin 8.9 (*)    HCT 26.4 (*)    MCV 74.2 (*)    MCH 25.0 (*)    RDW 20.3 (*)    Platelets 549 (*)    Neutrophils Relative % 85 (*)    Lymphocytes Relative 4 (*)    Neutro Abs 26.1 (*)    Monocytes Absolute 3.4 (*)    All other components within normal limits  COMPREHENSIVE METABOLIC PANEL - Abnormal; Notable for the following:    Sodium 120 (*)    Chloride 84 (*)    Glucose, Bld 66 (*)    Albumin 2.3 (*)    AST 54 (*)    Alkaline Phosphatase 200 (*)    Total Bilirubin 3.0 (*)    GFR calc non Af Amer 86 (*)    All other components within normal limits  URINALYSIS, ROUTINE W REFLEX MICROSCOPIC - Abnormal; Notable for the following:    Color, Urine ORANGE (*)    Bilirubin Urine LARGE (*)    Ketones, ur 15 (*)    Urobilinogen, UA 4.0 (*)    Leukocytes, UA TRACE (*)    All other components within normal limits  URINE MICROSCOPIC-ADD ON - Abnormal; Notable for the following:    Bacteria, UA FEW (*)    All other components within normal limits  LIPASE, BLOOD   Dg Chest 2  View  11/13/2012   *RADIOLOGY REPORT*  Clinical Data: Right upper quadrant pain.  CHEST - 2 VIEW  Comparison: 10/28/2012.  Findings: The heart, mediastinum and hilar contours are normal. There is a right IJ  Port-A-Cath in place with the tip in the mid superior vena cava.  The lungs are  clear.  There are no effusions or pneumothoraces.  There are no acute bony changes.  IMPRESSION: No active disease.   Original Report Authenticated By: Sander Radon, M.D.    Date: 11/13/2012  Rate: 96  Rhythm: normal sinus rhythm  QRS Axis: normal  Intervals: normal  ST/T Wave abnormalities: nonspecific T wave changes  Conduction Disutrbances:none  Narrative Interpretation:   Old EKG Reviewed: unchanged   1. Failure to thrive   2. Metastatic cancer   3. Abdominal  pain, other specified site   4. Weakness     MDM   Patient with a history significant for colon cancer with metastases to the liver. He's been taking tramadol and which is not relieving his pain and has not been eating or drinking due to worsening pain. He denies nausea vomiting or diarrhea. However he is complaining of diffuse weakness and is now fallen 3 times in the last few days. He lives alone and family who does help him has to work at night. On exam he has had significant hepatomegaly with firmness in the upper abdomen and cachexia. His lower extremity edema but no signs of ascites.  Currently patient is not receiving any chemotherapy or radiation. He may be a candidate for radiation seeds there is also talked to hospice. However hospice cannot come in unless he is not going to get the radiation.  Also patient is complaining of significant shortness of breath with even walking from one side of the room in his house to another. He denies chest pain at this time. His symptoms have worsened in the last one week per family.  CBC, CMP, lipase, UA, chest x-ray pending.  Patient given IV pain control and fluids.  Pt with leukocytosis of 30,000  which most likely is related to the cancer.  CXR wnl.  CMP with normal Cr but worsening Na of 120.  UA with ketones but no infection.  Will admit for failure to thrive and dehydration and intractable abd pain.  Gwyneth Sprout, MD 11/13/12 1945

## 2012-11-13 NOTE — ED Notes (Signed)
Flushed port with 10cc NS and clamped.

## 2012-11-13 NOTE — ED Notes (Signed)
Pt states he still is unable to urinate, will ask again in the next few minutes.

## 2012-11-13 NOTE — ED Notes (Signed)
Ok with Dr. Anitra Lauth to give pt ice chips.

## 2012-11-13 NOTE — ED Notes (Signed)
Pt in CT.  Report given to [place], RN on floor.

## 2012-11-13 NOTE — H&P (Signed)
[facility] History and Physical  Nathan Robbins EAV:[id number] DOB: 12/20/1945 DOA: 11/13/2012  Referring physician: Dr Anitra Lauth PCP: Enrique Sack, MD  Specialists: Dr Myrle Sheng  Chief Complaint: abdominal pain for many days.   HPI: Nathan Robbins is a 67 y.o. male with h/o metastatic colon cancer with liver mets, hypertension, came to in to ED for worsening and persistent abdominal pain. He is not on chemo any more and is scheduled to see [facility] FOR RADIO [facility]MBOLIZATION this Tuesday. He reports oxycodone is making him delusional and stopped taking it. He reports no apetite, hasn't eaten for 3 days and didn't have BM in 2 days. He denies nausea or vomiting. He was given pain medications in ED, reports pain is better. His labs in ED revealed leukocytosis of 30,000, hyponatremia, and anemia with hemoglobin of 8.9. His  CXR did not reveal any active disease. He is referred to hospitalist service for admission for failure to thrive and control of abd pain.   Review of Systems: The patient denies anorexia, fever,  vision loss, decreased hearing, hoarseness, chest pain, syncope, dyspnea on exertion,   hemoptysis,  melena, hematochezia, severe indigestion/heartburn, hematuria, incontinence, genital sores,  suspicious skin lesions, transient blindness, depression, unusual weight change, abnormal bleeding, angioedema, and breast masses.    Past Medical History  Diagnosis Date  . colon ca dx'd 2013  . Hypertension    History reviewed. No pertinent past surgical history. Social History:  reports that he has never smoked. He has never used smokeless tobacco. He reports that he uses illicit drugs. He reports that he does not drink alcohol. where does patient live--home, No Known Allergies  History reviewed. No pertinent family history.  Prior to Admission medications   Medication Sig Start Date End Date Taking? Authorizing Provider  diltiazem (TIAZAC) 360 MG 24 hr capsule Take 1 capsule (360 mg  total) by mouth every morning. 11/10/12  Yes Rana Snare, NP  FLUoxetine (PROZAC) 20 MG capsule Take 20 mg by mouth every morning. 06/01/12  Yes Rana Snare, NP  loperamide (IMODIUM) 2 MG capsule Take 2 mg by mouth 4 (four) times daily as needed for diarrhea or loose stools.   Yes Ladene Artist, MD  LORazepam (ATIVAN) 1 MG tablet Take 1 tablet (1 mg total) by mouth 3 (three) times daily as needed for anxiety. 10/29/12  Yes Nicole Pisciotta, PA-C  ondansetron (ZOFRAN) 8 MG tablet Take 1 tablet (8 mg total) by mouth every 8 (eight) hours as needed for nausea. 11/10/12  Yes Rana Snare, NP  polyethylene glycol (MIRALAX / GLYCOLAX) packet Take 17 g by mouth 2 (two) times daily as needed (constipation).   Yes Historical Provider, MD  predniSONE (DELTASONE) 10 MG tablet Take 10 mg by mouth every morning. To stimulate appetite 09/21/12  Yes Ladene Artist, MD  traMADol Janean Sark) 50 MG tablet Take 1/2 to 1 tablet by mouth q 6 hrs prn pain 11/12/12  Yes Ladene Artist, MD   Physical Exam: Filed Vitals:   11/13/12 1455  BP: 159/96  Pulse: 98  Temp: 97.6 F (36.4 C)  TempSrc: Oral  Resp: 20  SpO2: 98%    .Constitutional: Vital signs reviewed.  Patient is a well-developed and poorly-nourished  in no acute distress and cooperative with exam. Alert and oriented x3.  Head: Normocephalic and atraumatic Mouth:dry MM,  Eyes: PERRL, EOMI, conjunctivae normal, scleral icterus. PRESENT. Neck: Supple, Trachea midline normal ROM, No JVD, mass,  Cardiovascular: RRR, S1 normal, S2 normal,  no MRG, Pulmonary/Chest: decreased air entry at bases. No wheezing or rhonchi.  Abdominal: firm, generalized tenderness,  distended, bowel sounds are normal,  Musculoskeletal: 2+ pedal edema, ROM full and no nontender Neurological: A&O x3, Strength is decreased generalized, no facial asymmetry, speech normal. , no focal motor deficit,  Skin: Warm, dry and intact. No rash, cyanosis, or clubbing.        Labs on  Admission:  Basic Metabolic Panel:  Recent Labs Lab 11/13/12 1444  NA 120*  K 4.8  CL 84*  CO2 19  GLUCOSE 66*  BUN 21  CREATININE 0.96  CALCIUM 9.2   Liver Function Tests:  Recent Labs Lab 11/13/12 1444  AST 54*  ALT 19  ALKPHOS 200*  BILITOT 3.0*  PROT 7.2  ALBUMIN 2.3*    Recent Labs Lab 11/13/12 1444  LIPASE 21   No results found for this basename: AMMONIA,  in the last 168 hours CBC:  Recent Labs Lab 11/13/12 1444  WBC 30.7*  NEUTROABS 26.1*  HGB 8.9*  HCT 26.4*  MCV 74.2*  PLT 549*   Cardiac Enzymes: No results found for this basename: CKTOTAL, CKMB, CKMBINDEX, TROPONINI,  in the last 168 hours  BNP (last 3 results) No results found for this basename: PROBNP,  in the last 8760 hours CBG: No results found for this basename: GLUCAP,  in the last 168 hours  Radiological Exams on Admission: Dg Chest 2 View  11/13/2012   *RADIOLOGY REPORT*  Clinical Data: Right upper quadrant pain.  CHEST - 2 VIEW  Comparison: 10/28/2012.  Findings: The heart, mediastinum and hilar contours are normal. There is a right IJ  Port-A-Cath in place with the tip in the mid superior vena cava.  The lungs are clear.  There are no effusions or pneumothoraces.  There are no acute bony changes.  IMPRESSION: No active disease.   Original Report Authenticated By: Sander Radon, M.D.    EKG: leads reversed. Repeat EKG ordered.   Assessment/Plan Active Problems:  1. Abdominal pain: probably from metastatic colon cancer with liver mets.  - CT abd and pelvis ordered to evaluate for obstruction.  - pain control. IV anti emetics.   2. Metastatic colon cancer with liver mets.  : no longer on chemo , scheduled for embolization procedure on 7/2. Pt would benefit from  A palliative care consult for GOC and symptom management.   3. Microcytic anemia: stable.   4. Failure to thrive: he was started on prednisone for apetite and nutrition consulted.   5. Leukocytosis; possibly from  the metastatic colon cancer. procalcitonin levels ordered.   6. Constipation: stool softners ordered.   7. Hyponatremia: chronic, possibly from dehydration. Starting the patient on IV hydration with NS.   8. dvt prophylaxis.   Code Status: DNR Family Communication: noen at bedside, reports all his family members are out of town. Disposition Plan: pending.   Time spent: 70 minutes.   [facility] Pager [phone]  If 7PM-7AM, please contact night-coverage [email or web address] Password [facility] 11/13/2012, 8:17 PM

## 2012-11-13 NOTE — ED Notes (Signed)
Pt needs a little more time to be able to urinate.

## 2012-11-13 NOTE — ED Notes (Signed)
Please contact Tillman Sers (Sister) with any updated information at [phone].

## 2012-11-14 ENCOUNTER — Inpatient Hospital Stay (HOSPITAL_COMMUNITY): Payer: Medicaid Other

## 2012-11-14 DIAGNOSIS — D72829 Elevated white blood cell count, unspecified: Secondary | ICD-10-CM | POA: Diagnosis present

## 2012-11-14 DIAGNOSIS — D638 Anemia in other chronic diseases classified elsewhere: Secondary | ICD-10-CM | POA: Diagnosis present

## 2012-11-14 LAB — CBC
MCH: 23.4 pg — ABNORMAL LOW (ref 26.0–34.0)
MCHC: 31.4 g/dL (ref 30.0–36.0)
MCV: 74.6 fL — ABNORMAL LOW (ref 78.0–100.0)
Platelets: 465 10*3/uL — ABNORMAL HIGH (ref 150–400)
RBC: 3.42 MIL/uL — ABNORMAL LOW (ref 4.22–5.81)
RDW: 20.4 % — ABNORMAL HIGH (ref 11.5–15.5)

## 2012-11-14 LAB — BASIC METABOLIC PANEL
Calcium: 8.8 mg/dL (ref 8.4–10.5)
Creatinine, Ser: 0.82 mg/dL (ref 0.50–1.35)
GFR calc non Af Amer: >90 mL/min (ref >90)
Glucose, Bld: 119 mg/dL — ABNORMAL HIGH (ref 70–99)
Sodium: 120 mEq/L — ABNORMAL LOW (ref 135–145)

## 2012-11-14 LAB — GLUCOSE, CAPILLARY: Glucose-Capillary: 78 mg/dL (ref 70–99)

## 2012-11-14 MED ORDER — FUROSEMIDE 10 MG/ML IJ SOLN
40.0000 mg | Freq: Once | INTRAMUSCULAR | Status: AC
  Administered 2012-11-14: 40 mg via INTRAVENOUS
  Filled 2012-11-14: qty 4

## 2012-11-14 MED ORDER — HYDRALAZINE HCL 20 MG/ML IJ SOLN
5.0000 mg | Freq: Four times a day (QID) | INTRAMUSCULAR | Status: DC | PRN
  Administered 2012-11-14: 5 mg via INTRAVENOUS
  Filled 2012-11-14: qty 1

## 2012-11-14 NOTE — Progress Notes (Signed)
Patient [id number] Bartell      DOB: 02-23-1946      NWG:[id number]  Consult received by the Palliative Medicine Team. Visited with patient who states he was not up to talking.  Offered to ask his sister to be with [place]. He stated she works nights.  Patient agreeable to my return tomorrow.  Verdis Bassette L. Ladona Ridgel, MD MBA The Palliative Medicine Team at [facility] Phone: [phone] Pager: [phone]

## 2012-11-14 NOTE — Procedures (Signed)
US guided therapeutic paracentesis performed yielding 1.2 liters yellow fluid. No immediate complications. 

## 2012-11-14 NOTE — Progress Notes (Addendum)
[facility] PROGRESS NOTE  Nathan Robbins WUJ:[id number] DOB: [birth year] DOA: 11/13/2012 PCP: Enrique Sack, MD  Brief narrative: 67 y.o. male with history of metastatic colon cancer with liver mets, hypertension who presented to [facility] ED 11/13/2012 with worsening  persistent abdominal pain. Patient is  No longer on chemo and is scheduled to see [facility] for radioembolization this Tuesday. He reported oxycodone was making him delusional and he stopped taking it. He reported no appetite and no BM in 2 days. In ED, CBC revealed leukocytosis of 30,000, hyponatremia, and anemia with hemoglobin of 8.9. His CXR did not reveal any active disease.  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 - management per oncology   Hyponatremia - dehydration versus SIADH from malignancy - continue to monitor sodium level   Leukocytosis, unspecified - likely due to prednisone - patient afebrile    Anemia of chronic disease - secondary to history of malignancy - hemoglobin is 8.9, no indications for transfusion  Code Status: DNR/DNI Family Communication: no family at the bedside Disposition Plan: home when stable  Manson Passey, MD  [facility] Pager [phone]  If 7PM-7AM, please contact night-coverage [email or web address] Password [facility] 11/14/2012, 6:49 AM   LOS: 1 day   Consultants:  None   Procedures:  None   Antibiotics:  None   HPI/Subjective: No acute overnight events.  Objective: Filed  Vitals:   11/13/12 1455 11/13/12 2254 11/14/12 0320 11/14/12 0635  BP: 159/96 153/90 144/68 165/93  Pulse: 98 75 96 96  Temp: 97.6 F (36.4 C) 98.1 F (36.7 C)  97.3 F (36.3 C)  TempSrc: Oral Oral  Oral  Resp: 20 18  22   Height:  6\' 3"  (1.905 m)    Weight:  78.2 kg (172 lb 6.4 oz)    SpO2: 98% 100%  100%    Intake/Output Summary (Last 24 hours) at 11/14/12 0649 Last data filed at 11/14/12 0245  Gross per 24 hour  Intake      0 ml  Output    375 ml  Net   -375 ml    Exam:   General:  Pt is alert,  not in acute distress  Cardiovascular: Regular rate and rhythm, S1/S2 appreciated  Respiratory: Clear to auscultation bilaterally, no wheezing, no crackles  Abdomen: ascites, abdomen non tender but firm to palpation, bowel sounds present, no guarding  Extremities: LE edema, pulses DP and PT palpable bilaterally  Neuro: Grossly nonfocal  Data Reviewed: Basic Metabolic Panel:  Recent Labs Lab 11/13/12 1444  NA 120*  K 4.8  CL 84*  CO2 19  GLUCOSE 66*  BUN 21  CREATININE 0.96  CALCIUM 9.2   Liver Function Tests:  Recent Labs Lab 11/13/12 1444  AST 54*  ALT 19  ALKPHOS 200*  BILITOT 3.0*  PROT 7.2  ALBUMIN 2.3*    Recent Labs Lab 11/13/12 1444  LIPASE 21   No results found for this basename: AMMONIA,  in the last  168 hours CBC:  Recent Labs Lab 11/13/12 1444  WBC 30.7*  NEUTROABS 26.1*  HGB 8.9*  HCT 26.4*  MCV 74.2*  PLT 549*   Cardiac Enzymes: No results found for this basename: CKTOTAL, CKMB, CKMBINDEX, TROPONINI,  in the last 168 hours BNP: No components found with this basename: POCBNP,  CBG:  Recent Labs Lab 11/14/12 0351  GLUCAP 78    Recent Results (from the past 240 hour(s))  TECHNOLOGIST REVIEW     Status: None   Collection Time    11/04/12  3:27 PM      Result Value Range Status   Technologist Review few targets   Final     Studies: Dg Chest 2 View 11/13/2012   *  IMPRESSION: No active disease.   Original Report  Authenticated By: Sander Radon, M.D.   Ct Abdomen Pelvis W Contrast 11/13/2012  IMPRESSION:  1.  Since 08/06/2012, marked progression of hepatic metastasis. 2.  Developing lower thoracic adenopathy and new left lower lobe pulmonary nodule, both suspicious for metastatic disease. 3.  Development of marked ascites.  This could represent a component of liver failure or underlying peritoneal metastasis. 4.  Developing retroperitoneal nodal metastasis.   Original Report Authenticated By: Jeronimo Greaves, M.D.    Scheduled Meds: . diltiazem  360 mg Oral q morning - 10a  . docusate sodium  100 mg Oral BID  . enoxaparin (LOVENOX)  40 mg Subcutaneous Daily  . FLUoxetine  20 mg Oral q morning - 10a  . predniSONE  10 mg Oral q morning - 10a  . senna  1 tablet Oral BID

## 2012-11-15 DIAGNOSIS — Z515 Encounter for palliative care: Secondary | ICD-10-CM

## 2012-11-15 DIAGNOSIS — C189 Malignant neoplasm of colon, unspecified: Secondary | ICD-10-CM

## 2012-11-15 DIAGNOSIS — D638 Anemia in other chronic diseases classified elsewhere: Secondary | ICD-10-CM

## 2012-11-15 LAB — GLUCOSE, CAPILLARY: Glucose-Capillary: 78 mg/dL (ref 70–99)

## 2012-11-15 LAB — CBC
HCT: 25.5 % — ABNORMAL LOW (ref 39.0–52.0)
MCH: 24.2 pg — ABNORMAL LOW (ref 26.0–34.0)
MCHC: 32.5 g/dL (ref 30.0–36.0)
MCV: 74.3 fL — ABNORMAL LOW (ref 78.0–100.0)
Platelets: 439 10*3/uL — ABNORMAL HIGH (ref 150–400)
RDW: 20.5 % — ABNORMAL HIGH (ref 11.5–15.5)
WBC: 33.9 10*3/uL — ABNORMAL HIGH (ref 4.0–10.5)

## 2012-11-15 LAB — BASIC METABOLIC PANEL
BUN: 18 mg/dL (ref 6–23)
Calcium: 9 mg/dL (ref 8.4–10.5)
Creatinine, Ser: 0.86 mg/dL (ref 0.50–1.35)
GFR calc Af Amer: >90 mL/min (ref >90)
GFR calc non Af Amer: 90 mL/min — ABNORMAL LOW (ref >90)

## 2012-11-15 MED ORDER — ENSURE COMPLETE PO LIQD
237.0000 mL | Freq: Three times a day (TID) | ORAL | Status: DC
  Administered 2012-11-15 – 2012-11-17 (×2): 237 mL via ORAL

## 2012-11-15 NOTE — Progress Notes (Signed)
INITIAL NUTRITION ASSESSMENT  DOCUMENTATION CODES Per approved criteria  -Not Applicable   INTERVENTION: Provide Ensure Complete TID Encourage PO intake  NUTRITION DIAGNOSIS: Inadequate oral intake related to poor appetite as evidenced by pt's report.   Goal: Pt to meet >/= 90% of their estimated nutrition needs  Monitor:  PO intake Weight Labs  Reason for Assessment: Consult/MST  67 y.o. male  Admitting Dx: Abdominal pain  ASSESSMENT: 67 y.o. male with history of metastatic colon cancer with liver mets, hypertension who presented to [facility] ED 11/13/2012 with worsening persistent abdominal pain. Patient is No longer on chemo and is scheduled to see [facility] for radioembolization this Tuesday. Pt states that for the past 2 days he has had no appetite due to abdominal pain and he hasn't eaten anything except for Ensure two times per day. Pt reports that he doesn't usually drink Ensure, he usually eats 2 meals daily. Pt's usual body weight is 190-195 lbs. Pt states he has no eaten anything so far today.   Height: Ht Readings from Last 1 Encounters:  11/13/12 6\' 3"  (1.905 m)    Weight: Wt Readings from Last 1 Encounters:  11/13/12 172 lb 6.4 oz (78.2 kg)    Ideal Body Weight: 196 lbs  % Ideal Body Weight: 88%  Wt Readings from Last 10 Encounters:  11/13/12 172 lb 6.4 oz (78.2 kg)  11/10/12 172 lb 9.6 oz (78.291 kg)  11/04/12 169 lb 3.2 oz (76.749 kg)  10/21/12 172 lb 12.8 oz (78.382 kg)  10/07/12 172 lb 12.8 oz (78.382 kg)  10/05/12 185 lb (83.915 kg)  09/23/12 182 lb 1.6 oz (82.6 kg)  09/21/12 171 lb 11.2 oz (77.883 kg)  09/07/12 184 lb 1.6 oz (83.507 kg)  08/10/12 187 lb 9.6 oz (85.095 kg)    Usual Body Weight: 190 lbs  % Usual Body Weight: 90%  BMI:  Body mass index is 21.55 kg/(m^2).  Estimated Nutritional Needs: Kcal: 2300-2480 Protein: 102-117 grams Fluid: 2.3 L  Skin: intact; +2 RLE and LLE edema  Diet Order: General  EDUCATION NEEDS: -No education  needs identified at this time   Intake/Output Summary (Last 24 hours) at 11/15/12 1221 Last data filed at 11/15/12 0443  Gross per 24 hour  Intake    600 ml  Output    450 ml  Net    150 ml    Last BM: 6/29  Labs:   Recent Labs Lab 11/13/12 1444 11/14/12 0650 11/15/12 0800  NA 120* 120* 116*  K 4.8 4.8 4.7  CL 84* 87* 85*  CO2 19 19 21   BUN 21 18 18   CREATININE 0.96 0.82 0.86  CALCIUM 9.2 8.8 9.0  GLUCOSE 66* 119* 98    CBG (last 3)   Recent Labs  11/14/12 0324 11/14/12 0351  GLUCAP 78 78    Scheduled Meds: . diltiazem  360 mg Oral q morning - 10a  . docusate sodium  100 mg Oral BID  . enoxaparin (LOVENOX) injection  40 mg Subcutaneous Daily  . FLUoxetine  20 mg Oral q morning - 10a  . predniSONE  10 mg Oral q morning - 10a  . senna  1 tablet Oral BID    Continuous Infusions: . sodium chloride 10 mL/hr at 11/14/12 1320    Past Medical History  Diagnosis Date  . colon ca dx'd 2013  . Hypertension     History reviewed. No pertinent past surgical history.  Ian Malkin RD, LDN Inpatient Clinical Dietitian Pager: [phone] After  Hours Pager: [phone]

## 2012-11-15 NOTE — Progress Notes (Signed)
OT Cancellation Note  Patient Details Name: Nathan Robbins MRN: [id number] DOB: [birth year]   Cancelled Treatment:    Reason Eval/Treat Not Completed: Fatigue/lethargy limiting ability to participate  [name] 11/15/2012, 2:09 PM

## 2012-11-15 NOTE — Progress Notes (Signed)
[facility] PROGRESS NOTE  Nathan Robbins ZOX:[id number] DOB: June 21, 1945 DOA: 11/13/2012 PCP: Enrique Sack, MD  Brief narrative: 67 y.o. male with history of metastatic colon cancer with liver mets, hypertension who presented to [facility] ED 11/13/2012 with worsening persistent abdominal pain. Patient is No longer on chemo and is scheduled to see [facility] for radioembolization this Tuesday. He reported oxycodone was making him delusional and he stopped taking it. He reported no appetite and no BM in 2 days. In ED, CBC revealed leukocytosis of 30,000, hyponatremia, and anemia with hemoglobin of 8.9. His CXR did not reveal any active disease. Patient is status post paracentesis 11/14/2012 with 1.2 L fluid removed and with no subsequent complications.  Assessment/Plan:   Principal Problem:  Abdominal pain  - in the setting of  colon cancer with extensive hepatic and likely peritoneal metastasis and ascites  - paracentesis done 11/14/2012 with 1.2 L fluid removed, no complications - continue dilaudid 1 mg Q 3 hours IV PRN and norco PO Q 4 hours PRN  - we stopped IV fluids started on admission due to LE edema and pt was given 1 dose of lasix 40 mg IV 11/14/2012  Active Problems:  Failure to thrive in adult  - secondary to advanced malignancy  - nutrition consulted  - continue daily prednisone  Hypertension  - Cardizem 360 mg daily; BP 138/71 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 D/C plan in am so he can make it to his appointment.   Hyponatremia  - dehydration versus SIADH from malignancy  - continue to monitor sodium level  Leukocytosis, unspecified  - likely due to prednisone  - patient afebrile  Anemia of chronic disease  - secondary to history of malignancy  - hemoglobin is 8.3 today; no indications for transfusion   Code Status: DNR/DNI  Family Communication: no family at the bedside  Disposition Plan: home  when stable; likely in am  Manson Passey, MD  [facility]  Pager [phone]   Consultants:  Palliative care Procedures:  None  Antibiotics:  None    If 7PM-7AM, please contact night-coverage [email or web address] Password [facility] 11/15/2012, 6:56 AM   LOS: 2 days   HPI/Subjective: No acute overnight events.  Objective: Filed Vitals:   11/14/12 1036 11/14/12 1325 11/14/12 2132 11/15/12 0442  BP: 164/83 138/79 135/63 137/83  Pulse:  73 72 79  Temp:  98.3 F (36.8 C) 97.6 F (36.4 C) 97.4 F (36.3 C)  TempSrc:  Oral Oral Oral  Resp:  18 16 16   Height:      Weight:      SpO2:  100% 100% 100%    Intake/Output Summary (Last 24 hours) at 11/15/12 0656 Last data filed at 11/15/12 0443  Gross per 24 hour  Intake    840 ml  Output    450 ml  Net    390 ml    Exam:   General:  Pt is alert, follows commands appropriately, not in acute distress  Cardiovascular: Regular rate and rhythm, S1/S2 appreciated  Respiratory: Clear to auscultation bilaterally, no wheezing, no crackles, no rhonchi  Abdomen: Softly distended, (+) bowel sounds  Extremities: +1-2 LE pitting edema, pulses DP and PT palpable bilaterally  Neuro: Grossly nonfocal  Data Reviewed: Basic Metabolic Panel:  Recent Labs Lab 11/13/12 1444 11/14/12 0650  NA 120* 120*  K 4.8 4.8  CL 84* 87*  CO2 19 19  GLUCOSE 66* 119*  BUN 21 18  CREATININE 0.96 0.82  CALCIUM 9.2 8.8   Liver Function Tests:  Recent Labs Lab 11/13/12 1444  AST 54*  ALT 19  ALKPHOS 200*  BILITOT 3.0*  PROT 7.2  ALBUMIN 2.3*    Recent Labs Lab 11/13/12 1444  LIPASE 21   No results found for this basename: AMMONIA,  in the last 168 hours CBC:  Recent Labs Lab 11/13/12 1444 11/14/12 0650  WBC 30.7* 31.6*  NEUTROABS 26.1*  --   HGB 8.9* 8.0*  HCT 26.4* 25.5*  MCV 74.2* 74.6*  PLT 549* 465*   Cardiac Enzymes: No results found for this basename: CKTOTAL, CKMB, CKMBINDEX, TROPONINI,  in the last 168 hours BNP: No  components found with this basename: POCBNP,  CBG:  Recent Labs Lab 11/14/12 0351  GLUCAP 78    Studies: Dg Chest 2 View 11/13/2012   * IMPRESSION: No active disease.   Original Report Authenticated By: Sander Radon, M.D.   Ct Abdomen Pelvis W Contrast 11/13/2012   *  IMPRESSION:  1.  Since 08/06/2012, marked progression of hepatic metastasis. 2.  Developing lower thoracic adenopathy and new left lower lobe pulmonary nodule, both suspicious for metastatic disease. 3.  Development of marked ascites.  This could represent a component of liver failure or underlying peritoneal metastasis. 4.  Developing retroperitoneal nodal metastasis.   Original Report Authenticated By: Jeronimo Greaves, M.D.   US Paracentesis 11/14/2012   *  IMPRESSION: Successful ultrasound guided therapeutic paracentesis yielding 1.2 liters of ascites.  Read by: Jeananne Rama, P.A.-C   Original Report Authenticated By: Jolaine Click, M.D.    Scheduled Meds: . diltiazem  360 mg Oral q morning - 10a  . docusate sodium  100 mg Oral BID  . enoxaparin (LOVENOX) injection  40 mg Subcutaneous Daily  . FLUoxetine  20 mg Oral q morning - 10a  . predniSONE  10 mg Oral q morning - 10a  . senna  1 tablet Oral BID   Continuous Infusions: . sodium chloride 10 mL/hr at 11/14/12 1320

## 2012-11-15 NOTE — Evaluation (Signed)
Physical Therapy Evaluation Patient Details Name: Nathan Robbins MRN: [id number] DOB: 1945/06/26 Today's Date: 11/15/2012 Time: 4098-1191 PT Time Calculation (min): 18 min  PT Assessment / Plan / Recommendation History of Present Illness  67 yo male with colon ca with liver mets admitted with abdominal pain for many days  Clinical Impression  Pt does not feel well and he is deconditioned with limited ambulation. Last night he rolled and propelled himself with his legs in a wheelchair.  He feels that he will need a wheelchair for home as his ambulation with a walker is limited. He has limited participation with PT.    PT Assessment  Patient needs continued PT services    Follow Up Recommendations  Home health PT    Does the patient have the potential to tolerate intense rehabilitation      Barriers to Discharge        Equipment Recommendations  Wheelchair (measurements PT);Wheelchair cushion (measurements PT)    Recommendations for Other Services     Frequency Min 3X/week    Precautions / Restrictions Precautions Precautions: Fall   Pertinent Vitals/Pain Abdominal pain      Mobility  Bed Mobility Bed Mobility: Supine to Sit Supine to Sit: 5: Supervision Details for Bed Mobility Assistance: pt needs extra time.  He has to sit with eyes closed before he can get to standing or try to walk Transfers Transfers: Sit to Stand;Stand to Sit Sit to Stand: 4: Min guard Stand to Sit: 4: Min guard Ambulation/Gait Ambulation/Gait Assistance: 4: Min guard Ambulation Distance (Feet): 50 Feet Assistive device: Rolling walker Ambulation/Gait Assistance Details: assist for safety Gait Pattern: Step-through pattern;Trunk flexed Gait velocity: Pt sped up and impulsively walked quickly to sit down suddenly at end of walk. He could not say why he felt he needed to do this General Gait Details: Pt appears very deconditioned. He pushed RW out too far forward and self limited his distance  because he felt weak Stairs: No Wheelchair Mobility Wheelchair Mobility: No    Exercises     PT Diagnosis: Difficulty walking;Abnormality of gait;Generalized weakness;Acute pain  PT Problem List: Decreased strength;Decreased activity tolerance;Decreased mobility;Decreased knowledge of use of DME;Decreased safety awareness PT Treatment Interventions: Gait training;Functional mobility training;Therapeutic activities;Therapeutic exercise;Stair training;Patient/family education     PT Goals(Current goals can be found in the care plan section) Acute Rehab PT Goals Patient Stated Goal: To go for a treatment for his abdomen tomorrow PT Goal Formulation: With patient Time For Goal Achievement: 11/29/12 Potential to Achieve Goals: Fair  Visit Information  Last PT Received On: 11/15/12 Assistance Needed: +1 History of Present Illness: 67 yo male with colon ca with liver mets admitted with abdominal pain for many days       Prior Functioning  Home Living Family/patient expects to be discharged to:: Private residence Living Arrangements: Other relatives (nephew) Type of Home: House Home Access: Stairs to enter Secretary/administrator of Steps: 4 Entrance Stairs-Rails: Right;Left Home Layout: One level Home Equipment: Environmental consultant - 2 wheels Prior Function Level of Independence: Independent with assistive device(s) Communication Communication: No difficulties    Cognition  Cognition Arousal/Alertness: Awake/alert Behavior During Therapy: WFL for tasks assessed/performed;Agitated (Pt does not feel well ) Overall Cognitive Status: Within Functional Limits for tasks assessed    Extremity/Trunk Assessment Lower Extremity Assessment Lower Extremity Assessment: Overall WFL for tasks assessed (generalized weakness)   Balance Balance Balance Assessed: Yes Static Sitting Balance Static Sitting - Balance Support: No upper extremity supported;Feet supported Static Sitting - Level  of Assistance:  7: Independent Static Standing Balance Static Standing - Balance Support: Bilateral upper extremity supported;During functional activity Static Standing - Level of Assistance: 6: Modified independent (Device/Increase time)  End of Session PT - End of Session Activity Tolerance: Patient limited by fatigue Patient left: in chair;with call bell/phone within reach Nurse Communication: Mobility status  GP     Nathan Robbins 11/15/2012, 12:59 PM

## 2012-11-15 NOTE — Progress Notes (Signed)
Palliative Medicine Team SW Pt discussed in PMT rounds, referred for psychosocial support. Pt lying down, guarded in affect. Introduced myself and role for emotional support. Pt reports his spirits are "high, as high as they're going to get", refused to talk further. Left my contact info as needs arise.   Kennieth Francois, LCSWA PMT Phone [phone] Pager [phone]

## 2012-11-15 NOTE — Consult Note (Signed)
Patient Nathan Robbins      DOB: 06-18-1945      WUJ:[id number]     Consult Note from the Palliative Medicine Team at [facility]    Consult Requested by: Dr Elisabeth Pigeon    PCP: Enrique Sack, MD Reason for Consultation: Clarification of GOC and options    Phone Number:[phone]  Assessment of patients Current state: 67 yo male with metastatic colon cancer to liver.  Continued decline (weakness, weight loss, decreasing function) and worsening abdominal pain, no longer candidate to chemo; remains hopeful for radio embolization at [facility].  Admitted with leukocytosis, hyponatremia, anemia.  Overall poor prognosis.  Consult is for review of medical treatment options, clarification of goals of care and end of life issues, disposition and options, and symptom recommendation.  This NP Lorinda Creed reviewed medical records, received report from team, assessed the patient and then meet at the patient's bedside to discuss diagnosis prognosis, GOC, EOL wishes disposition and options.  I then spoke to his two sisters by telephone regarding the same.   A detailed discussion was had today regarding advanced directives.  Concepts specific to code status, artifical feeding and hydration, continued IV antibiotics and rehospitalization was had.  The difference between a aggressive medical intervention path  and a palliative comfort care path for this patient at this time was had.  Values and goals of care important to patient and family were attempted to be elicited.  Concept of Hospice and Palliative Care were discussed  Natural trajectory and expectations at EOL were discussed.  Questions and concerns addressed.  Family encouraged to call with questions or concerns.  PMT will continue to support holistically.    Goals of Care: 1.  Code Status: DNR/DNi   2. Scope of Treatment: Continue present medical treatment plan to optimize medical stablity for probably  discharge tomorrow in order to keep  appointment with [facility] for schedule appointment addressing embolization.  (Verbalized to all, Dr Kalman Drape concern that patient may not even be eligible for procedure related to abnormal  lab values)  4. Disposition:  Home.  Family is educated to self referral process for hospice services on return from  [facility].  Family is well acquainted with the hospice benefit.  Patient has a prognosis of 6 months or less with his metastatic cancer diagnosis.   3. Symptom Management:  1. Pain: Vicodin 5-325 mg eery 4 hrs prn 2. Bowel Regimen: Colace 100 mg bid   4. Psychosocial:  Emotional support to all.  There are many anticipatory decisions and care needs facing this patient and family.    Brief HPI: 67 yo male with metastatic colon cancer to liver.  Continued decline (weakness, weight loss, decreasing function) and worsening abdominal pain, no longer candidate to chemo; remains hopeful for radio embolization at [facility].  Admitted with leukocytosis, hyponatremia, anemia.  Overall poor prognosis.    ROS: weakness, fatigue, diffuse intermittant abdominal pain    PMH:  Past Medical History  Diagnosis Date  . colon ca dx'd 2013  . Hypertension      HQI:ONGEXBM reviewed. No pertinent past surgical history. I have reviewed the FH and SH and  If appropriate update it with new information. No Known Allergies Scheduled Meds: . diltiazem  360 mg Oral q morning - 10a  . docusate sodium  100 mg Oral BID  . enoxaparin (LOVENOX) injection  40 mg Subcutaneous Daily  . FLUoxetine  20 mg Oral q morning - 10a  . predniSONE  10 mg Oral  q morning - 10a  . senna  1 tablet Oral BID   Continuous Infusions: . sodium chloride 10 mL/hr at 11/14/12 1320   PRN Meds:.acetaminophen, acetaminophen, hydrALAZINE, HYDROcodone-acetaminophen, HYDROmorphone (DILAUDID) injection, LORazepam, ondansetron (ZOFRAN) IV, ondansetron, polyethylene glycol    BP 137/83  Pulse 79  Temp(Src) 97.4 F (36.3 C) (Oral)   Resp 16  Ht 6\' 3"  (1.905 m)  Wt 78.2 kg (172 lb 6.4 oz)  BMI 21.55 kg/m2  SpO2 100%   PPS: 50%   Intake/Output Summary (Last 24 hours) at 11/15/12 1137 Last data filed at 11/15/12 0443  Gross per 24 hour  Intake    600 ml  Output    450 ml  Net    150 ml   LBM: 11-14-12                       Physical Exam:  General: chronically ill appearing HEENT:  Left eye clouded, mm moist Chest:   Diminished in bases,  CVS: RRR Abdomen: distended, decreased BS Ext:  BLE +1 edema Neuro: alert and oriented X3  Labs: CBC    Component Value Date/Time   WBC 33.9* 11/15/2012 0800   WBC 20.3* 11/04/2012 1527   RBC 3.43* 11/15/2012 0800   RBC 3.46* 11/04/2012 1527   HGB 8.3* 11/15/2012 0800   HGB 8.7* 11/04/2012 1527   HCT 25.5* 11/15/2012 0800   HCT 26.9* 11/04/2012 1527   PLT 439* 11/15/2012 0800   PLT 472* 11/04/2012 1527   MCV 74.3* 11/15/2012 0800   MCV 77.6* 11/04/2012 1527   MCH 24.2* 11/15/2012 0800   MCH 25.1* 11/04/2012 1527   MCHC 32.5 11/15/2012 0800   MCHC 32.3 11/04/2012 1527   RDW 20.5* 11/15/2012 0800   RDW 21.7* 11/04/2012 1527   LYMPHSABS 1.2 11/13/2012 1444   LYMPHSABS 2.4 11/04/2012 1527   MONOABS 3.4* 11/13/2012 1444   MONOABS 1.5* 11/04/2012 1527   EOSABS 0.0 11/13/2012 1444   EOSABS 0.0 11/04/2012 1527   BASOSABS 0.0 11/13/2012 1444   BASOSABS 0.1 11/04/2012 1527    BMET    Component Value Date/Time   NA 116* 11/15/2012 0800   NA 127* 10/21/2012 1255   K 4.7 11/15/2012 0800   K 3.9 10/21/2012 1255   CL 85* 11/15/2012 0800   CL 96* 10/21/2012 1255   CO2 21 11/15/2012 0800   CO2 23 10/21/2012 1255   GLUCOSE 98 11/15/2012 0800   GLUCOSE 92 10/21/2012 1255   BUN 18 11/15/2012 0800   BUN 10.3 10/21/2012 1255   CREATININE 0.86 11/15/2012 0800   CREATININE 1.0 10/21/2012 1255   CALCIUM 9.0 11/15/2012 0800   CALCIUM 8.5 10/21/2012 1255   GFRNONAA 90* 11/15/2012 0800   GFRAA >90 11/15/2012 0800    CMP     Component Value Date/Time   NA 116* 11/15/2012 0800   NA 127* 10/21/2012 1255   K 4.7  11/15/2012 0800   K 3.9 10/21/2012 1255   CL 85* 11/15/2012 0800   CL 96* 10/21/2012 1255   CO2 21 11/15/2012 0800   CO2 23 10/21/2012 1255   GLUCOSE 98 11/15/2012 0800   GLUCOSE 92 10/21/2012 1255   BUN 18 11/15/2012 0800   BUN 10.3 10/21/2012 1255   CREATININE 0.86 11/15/2012 0800   CREATININE 1.0 10/21/2012 1255   CALCIUM 9.0 11/15/2012 0800   CALCIUM 8.5 10/21/2012 1255   PROT 7.2 11/13/2012 1444   PROT 7.4 10/21/2012 1255   ALBUMIN 2.3* 11/13/2012 1444  ALBUMIN 2.4* 10/21/2012 1255   AST 54* 11/13/2012 1444   AST 42* 10/21/2012 1255   ALT 19 11/13/2012 1444   ALT 17 10/21/2012 1255   ALKPHOS 200* 11/13/2012 1444   ALKPHOS 203* 10/21/2012 1255   BILITOT 3.0* 11/13/2012 1444   BILITOT 1.26* 10/21/2012 1255   GFRNONAA 90* 11/15/2012 0800   GFRAA >90 11/15/2012 0800      Time In Time Out Total Time Spent with Patient Total Overall Time  1030 1200 70 min 90 min    Greater than 50%  of this time was spent counseling and coordinating care related to the above assessment and plan. Lorinda Creed NP  Palliative Medicine Team Team Phone # [phone] Pager [phone]  Discussed above with Dr Elisabeth Pigeon and Dr Truett Perna

## 2012-11-15 NOTE — Significant Event (Signed)
CRITICAL VALUE ALERT  Critical value received:  Sodium 116  Date of notification:  11/15/2012  Time of notification:  0849  Critical value read back:yes  Nurse who received alert:  Enriqueta Shutter, RN  MD notified (1st page):  Elisabeth Pigeon   Time of first page:  [phone]  MD notified (2nd page):  Time of second page:  Responding MD:    Time MD responded:    Dr. Elisabeth Pigeon did not receive my page.  I addressed her in the unit huddle at 0900.  She acknowledged and did not order anything for the patient.
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
## 2012-11-16 DIAGNOSIS — F411 Generalized anxiety disorder: Secondary | ICD-10-CM | POA: Diagnosis present

## 2012-11-16 DIAGNOSIS — R5381 Other malaise: Secondary | ICD-10-CM

## 2012-11-16 DIAGNOSIS — R109 Unspecified abdominal pain: Secondary | ICD-10-CM

## 2012-11-16 DIAGNOSIS — R531 Weakness: Secondary | ICD-10-CM | POA: Diagnosis present

## 2012-11-16 MED ORDER — HYDROCODONE-ACETAMINOPHEN 5-325 MG PO TABS: 1.0000 | ORAL_TABLET | ORAL | Status: DC | PRN

## 2012-11-16 MED ORDER — HYDROMORPHONE HCL PF 1 MG/ML IJ SOLN
1.0000 mg | INTRAMUSCULAR | Status: DC | PRN
  Administered 2012-11-16 – 2012-11-17 (×12): 1 mg via INTRAVENOUS
  Filled 2012-11-16 (×12): qty 1

## 2012-11-16 MED ORDER — MORPHINE SULFATE ER 30 MG PO TBCR
30.0000 mg | EXTENDED_RELEASE_TABLET | Freq: Two times a day (BID) | ORAL | Status: DC
  Administered 2012-11-17: 30 mg via ORAL
  Filled 2012-11-16: qty 1

## 2012-11-16 MED ORDER — LORAZEPAM 1 MG PO TABS: 1.0000 mg | ORAL_TABLET | Freq: Three times a day (TID) | ORAL | Status: DC | PRN

## 2012-11-16 MED ORDER — LORAZEPAM 1 MG PO TABS
1.0000 mg | ORAL_TABLET | Freq: Four times a day (QID) | ORAL | Status: DC | PRN
  Administered 2012-11-17: 1 mg via ORAL
  Filled 2012-11-16: qty 1

## 2012-11-16 MED ORDER — MORPHINE SULFATE (CONCENTRATE) 10 MG /0.5 ML PO SOLN: 10.0000 mg | ORAL | Status: DC | PRN

## 2012-11-16 NOTE — Progress Notes (Signed)
IP PROGRESS NOTE  Subjective:   He continues to have abdominal pain. He is receiving frequent IV Dilaudid.   Objective: Vital signs in last 24 hours: Blood pressure 145/71, pulse 70, temperature 97.4 F (36.3 C), temperature source Oral, resp. rate 18, height 6\' 3"  (1.905 m), weight 172 lb 6.4 oz (78.2 kg), SpO2 100.00%.  Intake/Output from previous day: 06/30 0701 - 07/01 0700 In: 240 [P.O.:240] Out: 150 [Urine:150]  Physical Exam:  Lungs: clear anteriorly Cardiac: regular rate and rhythm Abdomen: marked hepatomegaly Extremities: 1+ pitting edema at the low leg bilaterally   Portacath/PICC-without erythema  Lab Results:  Recent Labs  11/14/12 0650 11/15/12 0800  WBC 31.6* 33.9*  HGB 8.0* 8.3*  HCT 25.5* 25.5*  PLT 465* 439*    BMET  Recent Labs  11/14/12 0650 11/15/12 0800  NA 120* 116*  K 4.8 4.7  CL 87* 85*  CO2 19 21  GLUCOSE 119* 98  BUN 18 18  CREATININE 0.82 0.86  CALCIUM 8.8 9.0    Studies/Results: No results found.  Medications: I have reviewed the patient's current medications.  Assessment/Plan:  1.metastatic colon cancer-clinical and x-ray evidence of disease progression despite multiple systemic therapies.  2. Pain secondary to extensive liver metastases-now using frequent IV Dilaudid  3.leukocytosis-likely a leukemoid reaction secondary to metastatic carcinoma  4. History of oxaliplatin neuropathy  He continues to have severe abdominal pain. He is using frequent IV Dilaudid. I recommend adding a long acting narcotic in order to get him home with hospice or to [facility]. He would like to attend the appointment at [facility] if possible, but I think it is very unlikely he will be a candidate for radioembolization.  Recommendations:  1. add MS Contin and Roxanol for pain 2. Forward the admission chemistry panel and 11/13/2012 CT to the [facility] interventional radiologyphysician 3. [facility] hospice referral to consider home care versus  [facility]     LOS: 3 days   [place], Jillyn Hidden  11/16/2012, 3:14 PM

## 2012-11-16 NOTE — Progress Notes (Signed)
[facility] PROGRESS NOTE  Nathan Robbins ZOX:[id number] DOB: 02-09-1946 DOA: 11/13/2012 PCP: Enrique Sack, MD  Brief narrative: 67 y.o. male with history of metastatic colon cancer with liver mets, hypertension who presented to [facility] ED 11/13/2012 with worsening persistent abdominal pain. Patient is No longer on chemo and is scheduled to see [facility] for radioembolization this Tuesday. He reported oxycodone was making him delusional and he stopped taking it. He reported no appetite and no BM in 2 days. In ED, CBC revealed leukocytosis of 30,000, hyponatremia, and anemia with hemoglobin of 8.9. His CXR did not reveal any active disease. Patient is status post paracentesis 11/14/2012 with 1.2 L fluid removed and with no subsequent complications. Hospital course is being complicated by worsening pain and patient requiring Dilaudid 1 mg IV every one hour as needed for pain. Even though plan was for him to be discharged today to go to [facility] for radio embolization this may not be possible considering his uncontrolled pain.   Assessment/Plan:   Principal Problem:  Abdominal pain  - in the setting of colon cancer with extensive hepatic and likely peritoneal metastasis and ascites  - paracentesis done 11/14/2012 with 1.2 L fluid removed, no complications  - Patient requires dilaudid 1 mg Q 1 hours IV PRN for pain control - we stopped IV fluids started on admission due to LE edema and pt was given 1 dose of lasix 40 mg IV 11/14/2012  - Appreciate palliative care team following. Possible hospice placement. Active Problems:  Failure to thrive in adult  - secondary to advanced malignancy  - nutrition consulted  - continue daily prednisone  Hypertension  - Cardizem 360 mg daily; SBP 171 - Added hydralazine IV when necessary for better blood pressure control Malignant neoplasm of colon with hepatic and peritoneal metastases  - Appreciate palliative care team following. Possible hospice  placement. Hyponatremia  - dehydration versus SIADH from malignancy  - continue to monitor sodium level  Leukocytosis, unspecified  - likely due to prednisone  - patient afebrile  Anemia of chronic disease  - secondary to history of malignancy  - hemoglobin is 8.3 today; no indications for transfusion   Code Status: DNR/DNI  Family Communication: no family at the bedside  Disposition Plan: Pending discharge plan due to acute illness  Manson Passey, MD  [facility]  Pager [phone]   Consultants:  Palliative care Oncology (Dr. Thornton Papas)  Procedures:  None  Antibiotics:  None    If 7PM-7AM, please contact night-coverage [email or web address] Password [facility] 11/16/2012, 11:27 AM   LOS: 3 days    HPI/Subjective: Patient in more pain in past 24 hours.  Objective: Filed Vitals:   11/15/12 0442 11/15/12 1325 11/15/12 2118 11/16/12 0616  BP: 137/83 138/71 145/67 171/74  Pulse: 79 75 68 95  Temp: 97.4 F (36.3 C) 97.4 F (36.3 C) 97.8 F (36.6 C) 99.8 F (37.7 C)  TempSrc: Oral Oral Oral Oral  Resp: 16 18 16 18   Height:      Weight:      SpO2: 100% 100% 100% 99%    Intake/Output Summary (Last 24 hours) at 11/16/12 1127 Last data filed at 11/16/12 8295  Gross per 24 hour  Intake    240 ml  Output    150 ml  Net     90 ml    Exam:   General:  Pt is sleeping, not in acute distress  Cardiovascular: Regular rate and rhythm, S1/S2 apprecaited  Respiratory: Clear to auscultation bilaterally, no wheezing  Abdomen: Softly distended, bowel sounds present, no guarding  Extremities: (+1) LE edema, pulses DP and PT palpable bilaterally  Neuro: Grossly nonfocal  Data Reviewed: Basic Metabolic Panel:  Recent Labs Lab 11/13/12 1444 11/14/12 0650 11/15/12 0800  NA 120* 120* 116*  K 4.8 4.8 4.7  CL 84* 87* 85*  CO2 19 19 21   GLUCOSE 66* 119* 98  BUN 21 18 18   CREATININE 0.96 0.82 0.86  CALCIUM 9.2 8.8 9.0   Liver Function Tests:  Recent Labs Lab 11/13/12 1444   AST 54*  ALT 19  ALKPHOS 200*  BILITOT 3.0*  PROT 7.2  ALBUMIN 2.3*    Recent Labs Lab 11/13/12 1444  LIPASE 21   No results found for this basename: AMMONIA,  in the last 168 hours CBC:  Recent Labs Lab 11/13/12 1444 11/14/12 0650 11/15/12 0800  WBC 30.7* 31.6* 33.9*  NEUTROABS 26.1*  --   --   HGB 8.9* 8.0* 8.3*  HCT 26.4* 25.5* 25.5*  MCV 74.2* 74.6* 74.3*  PLT 549* 465* 439*   Cardiac Enzymes: No results found for this basename: CKTOTAL, CKMB, CKMBINDEX, TROPONINI,  in the last 168 hours BNP: No components found with this basename: POCBNP,  CBG:  Recent Labs Lab 11/14/12 0324 11/14/12 0351  GLUCAP 78 78    No results found for this or any previous visit (from the past 240 hour(s)).   Studies: No results found.  Scheduled Meds: . diltiazem  360 mg Oral q morning - 10a  . docusate sodium  100 mg Oral BID  . enoxaparin (LOVENOX) injection  40 mg Subcutaneous Daily  . feeding supplement  237 mL Oral TID BM  . FLUoxetine  20 mg Oral q morning - 10a  . predniSONE  10 mg Oral q morning - 10a  . senna  1 tablet Oral BID   Continuous Infusions: . sodium chloride 10 mL/hr at 11/14/12 1320

## 2012-11-16 NOTE — Progress Notes (Signed)
Clinical Social Work Department BRIEF PSYCHOSOCIAL ASSESSMENT 11/16/2012  Patient:  Nathan Robbins, Nathan Robbins     Account Number:  [id number]     Admit date:  11/13/2012  Clinical Social Worker:  Jacelyn Grip  Date/Time:  11/16/2012 04:15 PM  Referred by:  Physician  Date Referred:  11/16/2012 Referred for  Residential hospice placement   Other Referral:   Interview type:  Patient Other interview type:   and patients two sisters at bedside    PSYCHOSOCIAL DATA Living Status:  FAMILY Admitted from facility:   Level of care:   Primary support name:  Rose Speller/sister Delorise Shiner Kesler/sister Primary support relationship to patient:  SIBLING Degree of support available:   strong    CURRENT CONCERNS Current Concerns  Post-Acute Placement   Other Concerns:    SOCIAL WORK ASSESSMENT / PLAN CSW received referral for residential hospice placement.    CSW met with pt and pt sisters at bedside to discuss. CSW discussed process of residential hospice and provided residential hospice list. Pt and pt sister choose [facility] as first choice for residential hospice. CSW encourage pt and pt sisters to have second choice in mind in the event that [facility] is full and pt and pt sisters would be agreeable to [facility] if needed to expand search.    CSW contacted [facility], Forrestine Him and gave referral for residential hospice.    CSW received notification from [facility], Forrestine Him that pt has bed available at [facility] for tomorrow.    CSW notified pt at bedside and pt sisters via telephone.    CSW notified MD.    CSW to facilitate pt discharge needs to [facility] tomorrow.   Assessment/plan status:  Psychosocial Support/Ongoing Assessment of Needs Other assessment/ plan:   discharge planning   Information/referral to community resources:   Residential Hospice List    PATIENT'S/FAMILY'S RESPONSE TO PLAN OF CARE: Pt alert and oriented x 4.  Pt and pt sisters relieved that pt has bed available for [facility] for tomorrow.     Jacklynn Lewis, MSW, LCSWA  Clinical Social Work [phone]

## 2012-11-16 NOTE — Progress Notes (Signed)
Progress Note from the Palliative Medicine Team at [facility]  Subjective: patient is alert and oriented, having difficulty with pain management  -discussed with patient importance of making decisions regarding his medical care, aggressive medical interventions, vs comfort care path.  The difference is discussed in detail.  The natural trajectory at EOL is discussed with the patient.  Questions and concerns addressed  -meet again with two sisters and discussed patients plan and wish for residential hospice and comfort as the focus, he no longer desires to keep appointment with [facility] (sister will cancel)  Objective: No Known Allergies Scheduled Meds: . diltiazem  360 mg Oral q morning - 10a  . docusate sodium  100 mg Oral BID  . enoxaparin (LOVENOX) injection  40 mg Subcutaneous Daily  . feeding supplement  237 mL Oral TID BM  . FLUoxetine  20 mg Oral q morning - 10a  . predniSONE  10 mg Oral q morning - 10a  . senna  1 tablet Oral BID   Continuous Infusions: . sodium chloride 10 mL/hr at 11/14/12 1320   PRN Meds:.acetaminophen, acetaminophen, hydrALAZINE, HYDROcodone-acetaminophen, HYDROmorphone (DILAUDID) injection, LORazepam, ondansetron (ZOFRAN) IV, ondansetron, polyethylene glycol  BP 171/74  Pulse 95  Temp(Src) 99.8 F (37.7 C) (Oral)  Resp 18  Ht 6\' 3"  (1.905 m)  Wt 78.2 kg (172 lb 6.4 oz)  BMI 21.55 kg/m2  SpO2 99%   PPS: 40 %  Pain Score: significant abdominal pain 8/10 intermittantly Pain Location abdominal   Intake/Output Summary (Last 24 hours) at 11/16/12 0833 Last data filed at 11/16/12 0616  Gross per 24 hour  Intake    240 ml  Output    150 ml  Net     90 ml       Physical Exam:  General:  Ill appearing, generally uncomforable HEENT:  Mm, no exudate noted Chest:   Decreased in bases, CTA CVS: RRR Abdomen: distended, firm, tender to gentle palpation  Ext: +2 BLE edema Neuro:  Oriented X3, with capacity to make medical decsions  Labs: CBC     Component Value Date/Time   WBC 33.9* 11/15/2012 0800   WBC 20.3* 11/04/2012 1527   RBC 3.43* 11/15/2012 0800   RBC 3.46* 11/04/2012 1527   HGB 8.3* 11/15/2012 0800   HGB 8.7* 11/04/2012 1527   HCT 25.5* 11/15/2012 0800   HCT 26.9* 11/04/2012 1527   PLT 439* 11/15/2012 0800   PLT 472* 11/04/2012 1527   MCV 74.3* 11/15/2012 0800   MCV 77.6* 11/04/2012 1527   MCH 24.2* 11/15/2012 0800   MCH 25.1* 11/04/2012 1527   MCHC 32.5 11/15/2012 0800   MCHC 32.3 11/04/2012 1527   RDW 20.5* 11/15/2012 0800   RDW 21.7* 11/04/2012 1527   LYMPHSABS 1.2 11/13/2012 1444   LYMPHSABS 2.4 11/04/2012 1527   MONOABS 3.4* 11/13/2012 1444   MONOABS 1.5* 11/04/2012 1527   EOSABS 0.0 11/13/2012 1444   EOSABS 0.0 11/04/2012 1527   BASOSABS 0.0 11/13/2012 1444   BASOSABS 0.1 11/04/2012 1527    BMET    Component Value Date/Time   NA 116* 11/15/2012 0800   NA 127* 10/21/2012 1255   K 4.7 11/15/2012 0800   K 3.9 10/21/2012 1255   CL 85* 11/15/2012 0800   CL 96* 10/21/2012 1255   CO2 21 11/15/2012 0800   CO2 23 10/21/2012 1255   GLUCOSE 98 11/15/2012 0800   GLUCOSE 92 10/21/2012 1255   BUN 18 11/15/2012 0800   BUN 10.3 10/21/2012 1255   CREATININE  0.86 11/15/2012 0800   CREATININE 1.0 10/21/2012 1255   CALCIUM 9.0 11/15/2012 0800   CALCIUM 8.5 10/21/2012 1255   GFRNONAA 90* 11/15/2012 0800   GFRAA >90 11/15/2012 0800    CMP     Component Value Date/Time   NA 116* 11/15/2012 0800   NA 127* 10/21/2012 1255   K 4.7 11/15/2012 0800   K 3.9 10/21/2012 1255   CL 85* 11/15/2012 0800   CL 96* 10/21/2012 1255   CO2 21 11/15/2012 0800   CO2 23 10/21/2012 1255   GLUCOSE 98 11/15/2012 0800   GLUCOSE 92 10/21/2012 1255   BUN 18 11/15/2012 0800   BUN 10.3 10/21/2012 1255   CREATININE 0.86 11/15/2012 0800   CREATININE 1.0 10/21/2012 1255   CALCIUM 9.0 11/15/2012 0800   CALCIUM 8.5 10/21/2012 1255   PROT 7.2 11/13/2012 1444   PROT 7.4 10/21/2012 1255   ALBUMIN 2.3* 11/13/2012 1444   ALBUMIN 2.4* 10/21/2012 1255   AST 54* 11/13/2012 1444   AST 42* 10/21/2012 1255   ALT 19  11/13/2012 1444   ALT 17 10/21/2012 1255   ALKPHOS 200* 11/13/2012 1444   ALKPHOS 203* 10/21/2012 1255   BILITOT 3.0* 11/13/2012 1444   BILITOT 1.26* 10/21/2012 1255   GFRNONAA 90* 11/15/2012 0800   GFRAA >90 11/15/2012 0800       Assessment and Plan: 1. Code Status: DNR/DNI- comfort is main focus of care 2. Symptom Control: Pain- increase Dilaudid IVfrequency to every 1 hr prn Anxiety- Ativan 1 mg every 6 hrs prn 3. Psycho/Social:  Emotional support offered to patient, he denies fear of dying presently.  His only fear is pain control 4. Spiritual: refuses chaplain services at this time 5. Disposition:  Hopeful for residential hospice, will write for choice.  Patient understands his limited prognosis.    Time In Time Out Total Time Spent with Patient Total Overall Time  1430 1530 40 min 60 min    Greater than 50%  of this time was spent counseling and coordinating care related to the above assessment and plan.  Lorinda Creed NP  Palliative Medicine Team Team Phone # [phone] Pager [phone]  Discussed above with Dr Elisabeth Pigeon  1

## 2012-11-16 NOTE — Progress Notes (Signed)
OT Cancellation Note  Patient Details Name: Nathan Robbins MRN: [id number] DOB: 1945/10/26   Cancelled Treatment:    Reason Eval/Treat Not Completed: Fatigue/lethargy limiting ability to participate Pt just stated he got settled down and ask OT to return next day.  [name] 11/16/2012, 12:09 PM

## 2012-11-17 ENCOUNTER — Telehealth: Payer: Self-pay | Admitting: *Deleted

## 2012-11-17 MED ORDER — DSS 100 MG PO CAPS: 100.0000 mg | ORAL_CAPSULE | Freq: Two times a day (BID) | ORAL | Status: AC

## 2012-11-17 MED ORDER — SENNA 8.6 MG PO TABS: 1.0000 | ORAL_TABLET | Freq: Two times a day (BID) | ORAL | Status: AC

## 2012-11-17 MED ORDER — HYDROCODONE-ACETAMINOPHEN 5-325 MG PO TABS: 1.0000 | ORAL_TABLET | ORAL | Status: AC | PRN

## 2012-11-17 MED ORDER — LORAZEPAM 1 MG PO TABS: 1.0000 mg | ORAL_TABLET | Freq: Three times a day (TID) | ORAL | Status: AC | PRN

## 2012-11-17 MED ORDER — MORPHINE SULFATE ER 30 MG PO TBCR: 30.0000 mg | EXTENDED_RELEASE_TABLET | Freq: Two times a day (BID) | ORAL | Status: AC
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
## 2012-11-17 NOTE — Consult Note (Signed)
[facility] Liaison: Received request from CSW Suzanna for patient interest in [facility] 11/16/12. [facility] room available this morning for Mr. Emanuelson. He has completed registration paper work for transfer today. Made CSW aware. Will need DC summary faxed to [phone] and RN to call report to [phone]. Thank you. Forrestine Him LCSW [phone]

## 2012-11-17 NOTE — Progress Notes (Signed)
OT Cancellation Note  Patient Details Name: Nathan Robbins MRN: [id number] DOB: [birth year]   Cancelled Treatment:    Reason Eval/Treat Not Completed: OT screened, no needs identified, will sign off. Pt to d/c to [facility] today for hospice care  Galen Manila, [place] 11/17/2012, 10:48 AM

## 2012-11-17 NOTE — Progress Notes (Signed)
PT Cancellation Note  Patient Details Name: Nathan Robbins MRN: [id number] DOB: May 19, 1946   Cancelled Treatment:    Pt to go to [facility] today. Will discontinue from PT attempts   Donnetta Hail 11/17/2012, 8:27 AM

## 2012-11-17 NOTE — Plan of Care (Signed)
Problem: Phase III Progression Outcomes Goal: Pain controlled on oral analgesia Outcome: Not Met (add Reason) Patient discharged to [facility] Will receive IV pain medication for comfort care and end of life care.  Goal: IV/normal saline lock discontinued Outcome: Not Met (add Reason) Patient discharged with [facility] accessed per MD order.

## 2012-11-17 NOTE — Telephone Encounter (Signed)
Heather RN called reporting patient arrived at [facility] from [facility] and is having pain at an eight of of ten on the pain scale.  Was given Dilaudid 1 mg IV before transport.  Was receiving Dilaudid IV every hour in the hospital and discharge orders are for vicodin and MS Contin.  Patient's port-a-cath is accessed.    Dr. Truett Perna notified, verbal orders received and read back for Roxanol 10 to 20 mg q 2 hrs and dilaudid may be increased to 2 mg IV.  Called Heather at [phone] with these orders.

## 2012-11-17 NOTE — Progress Notes (Signed)
Pt for discharge to [facility].   CSW facilitated pt discharge needs including faxing pt discharge information to [facility], discussing with pt at bedside and pt sisters via telephone, providing RN phone number to call report, and arranging ambulance transportation for pt to [facility] (Service Request ID#: [id number])  No further social work needs identified at this time.   CSW signing off.   Jacklynn Lewis, MSW, LCSWA  Clinical Social Work [phone]

## 2012-11-17 NOTE — Progress Notes (Signed)
Patient discharged per MD order to [facility]. PAC left accessed per MD order for use at [facility]. Report called to [facility]. Pt transported via [facility] to [facility]. Family (Pt's sister) notified by this RN that pt has been discharged and is on the way to [facility]. Angelena Form, RN

## 2012-11-25 ENCOUNTER — Ambulatory Visit: Payer: Medicaid Other | Admitting: Nurse Practitioner

## 2012-11-30 ENCOUNTER — Telehealth: Payer: Self-pay | Admitting: *Deleted

## 2012-11-30 NOTE — Telephone Encounter (Signed)
Fax from [facility] that patient died 26-Dec-2012 @2007 . MD was made aware.

## 2012-12-01 ENCOUNTER — Telehealth: Payer: Self-pay | Admitting: Dietician

## 2012-12-17 DEATH — deceased
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
## 2013-08-11 IMAGING — CR DG CHEST 2V
3 series · 3 of 3 positions shown · non-contrast
Comparison: 10/28/2012.

CLINICAL DATA: Right upper quadrant pain.

CHEST - 2 VIEW

[w chest lat]
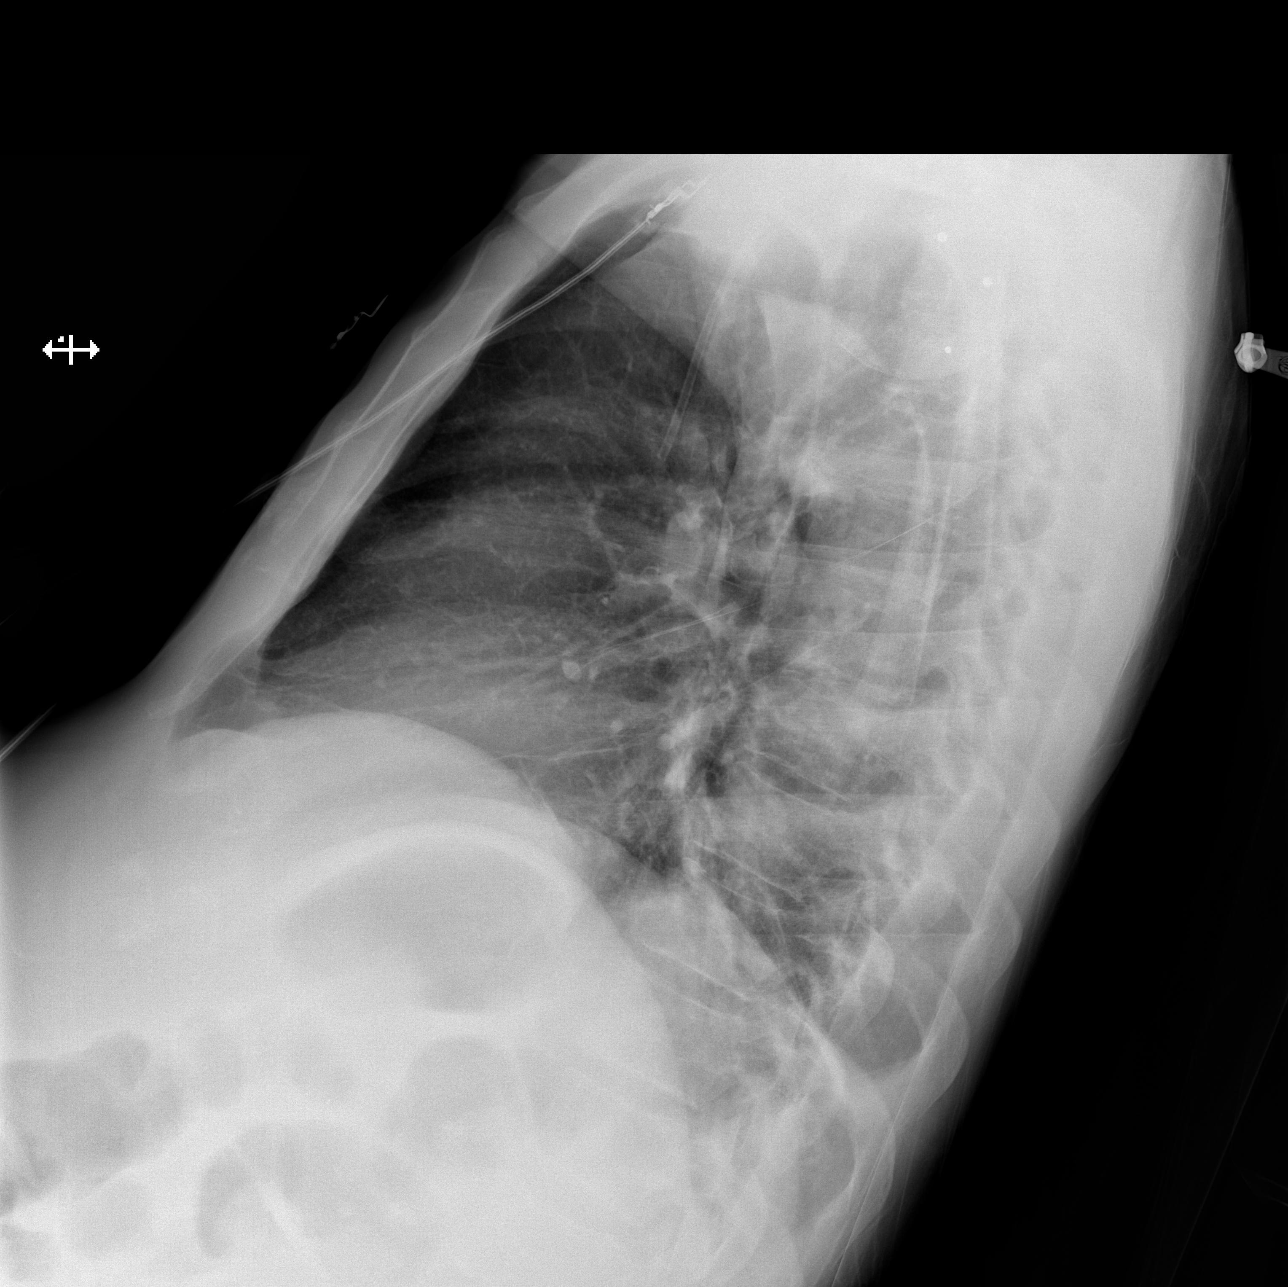

[x chest ap (1 of 2)]
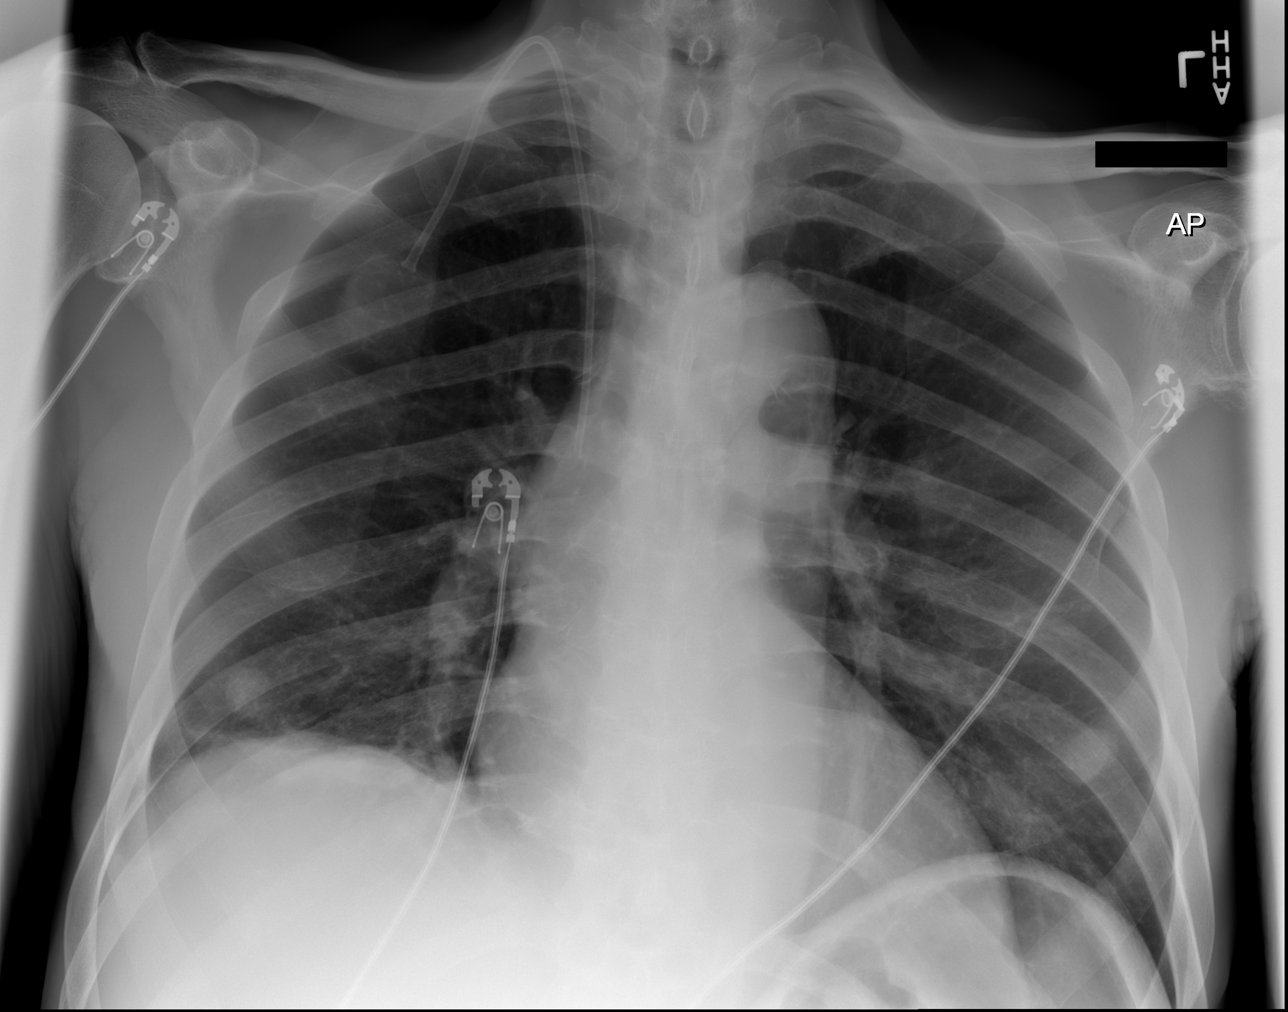

[x chest ap (2 of 2)]
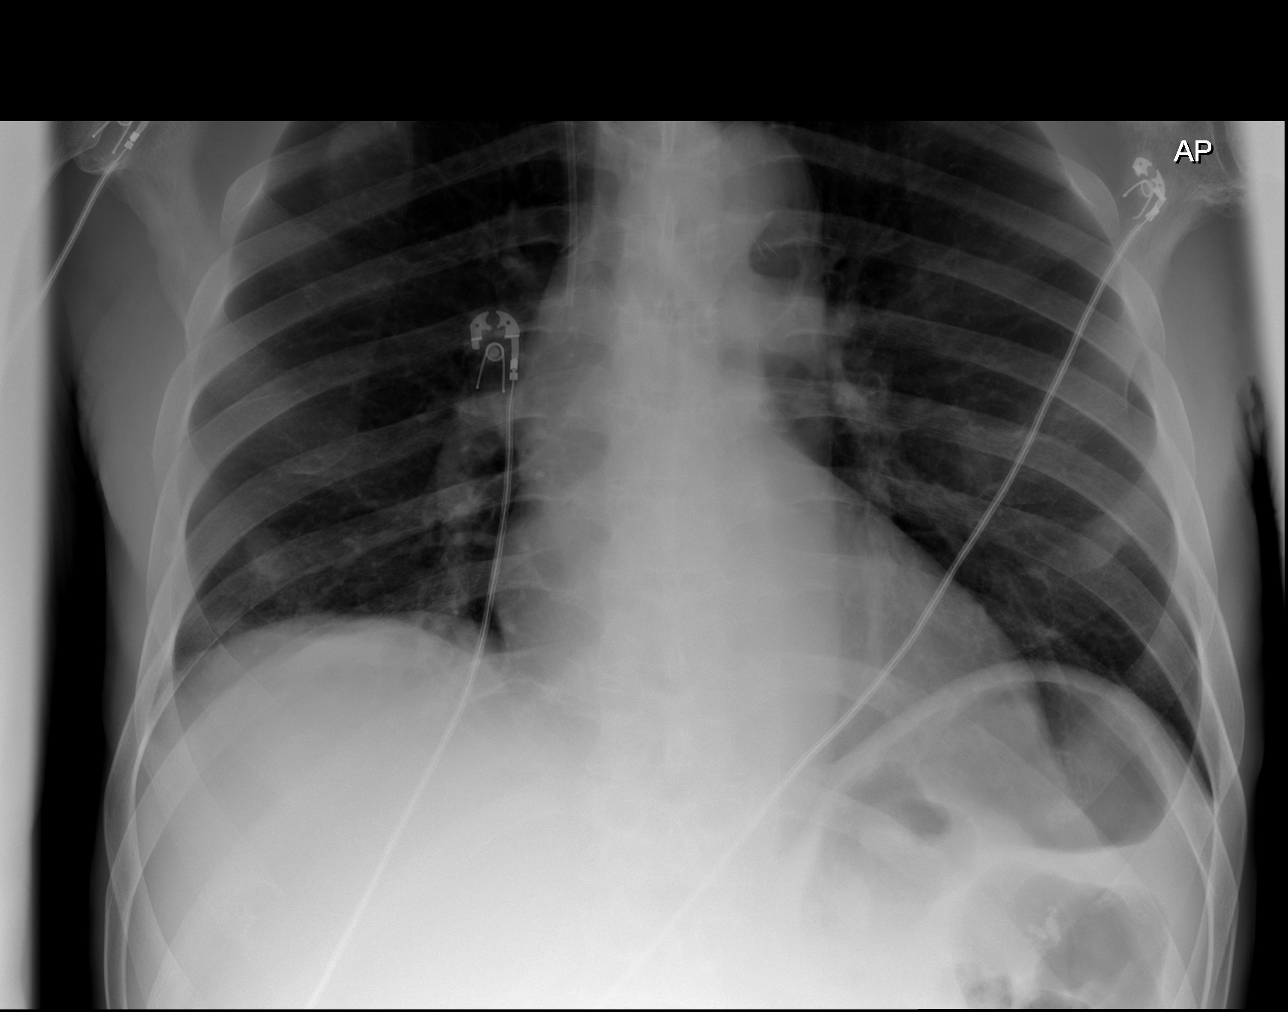

[3 of 3 positions shown; findings below may reference images not displayed]

FINDINGS: The heart, mediastinum and hilar contours are normal.
There is a right IJ  Port-A-Cath in place with the tip in the mid
superior vena cava.  The lungs are clear.  There are no effusions
or pneumothoraces.  There are no acute bony changes.
IMPRESSION: No active disease.

## 2013-08-12 IMAGING — US US PARACENTESIS
1 series · 12 of 12 positions shown · non-contrast
Comparison: none

CLINICAL DATA: Metastatic colon carcinoma, abdominal pain, ascites.
Request is made for therapeutic paracentesis.

[Series 1: us paracentesis · 0.35mm/px · 12 of 12 slices shown]
[im 1/12]
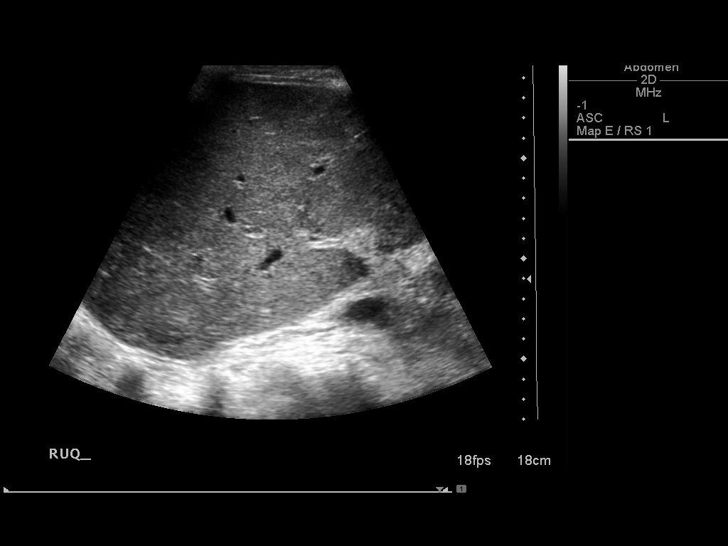
[im 2/12]
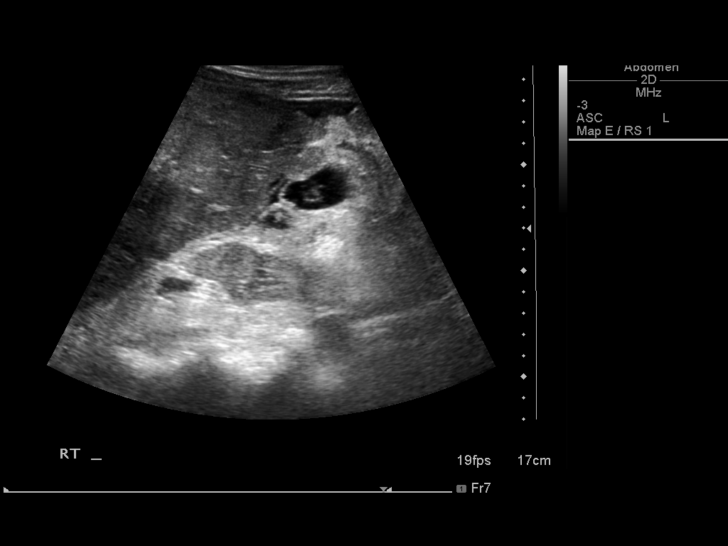
[im 3/12]
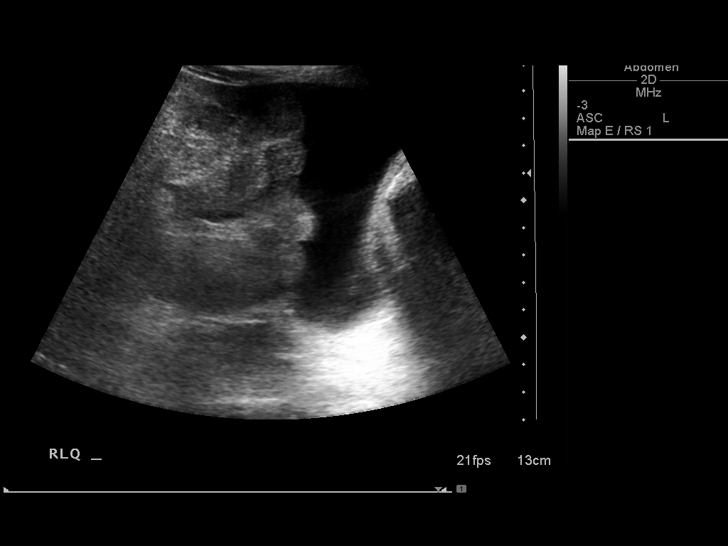
[im 4/12]
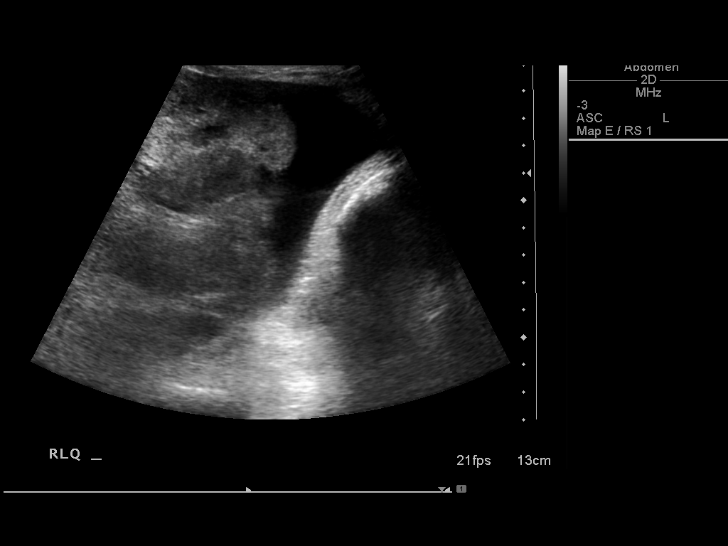
[im 5/12]
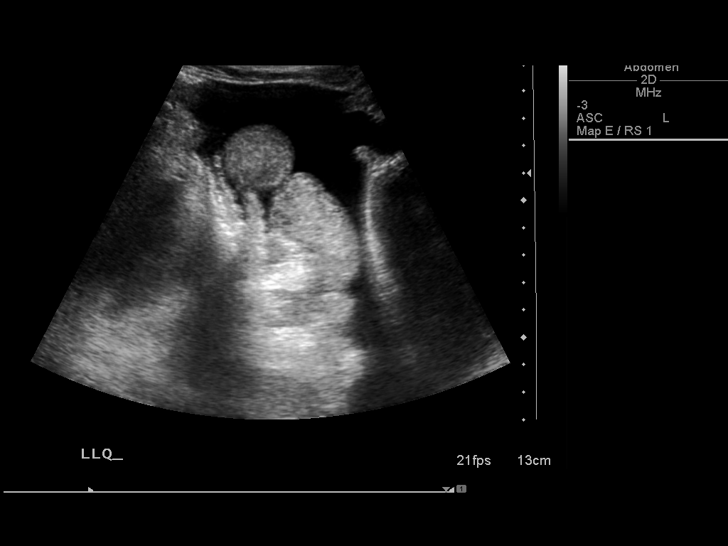
[im 6/12]
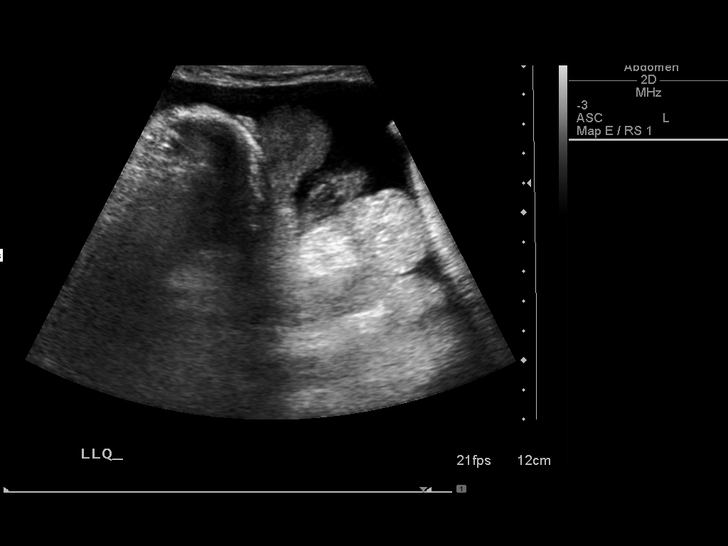
[im 7/12]
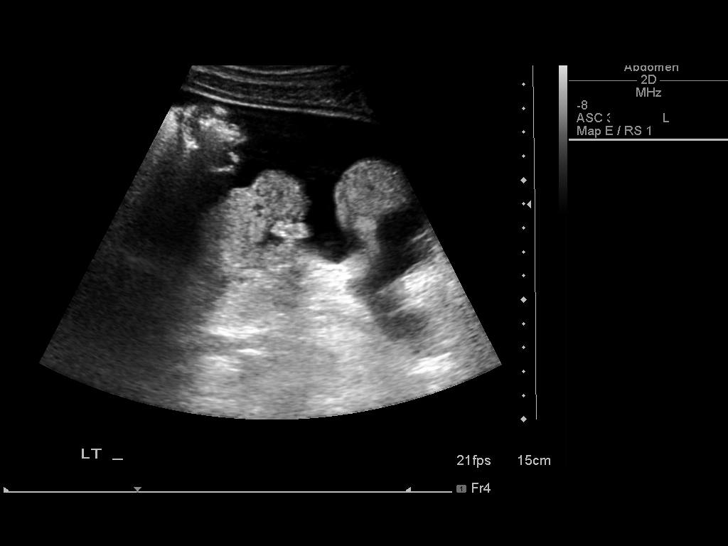
[im 8/12]
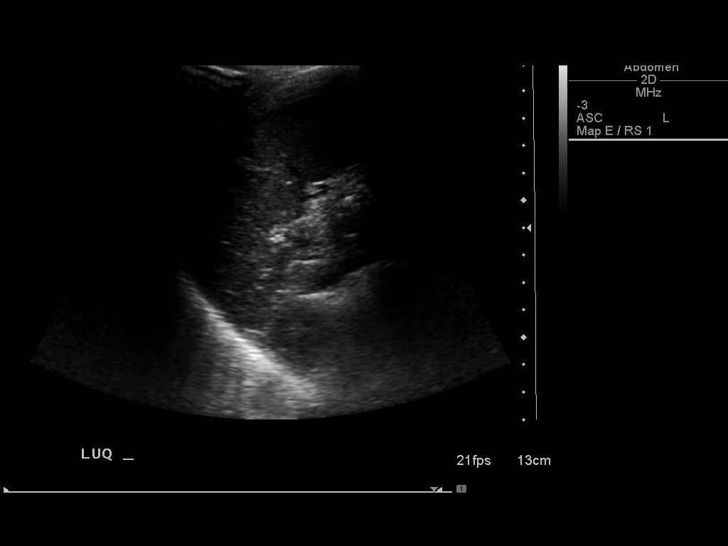
[im 9/12]
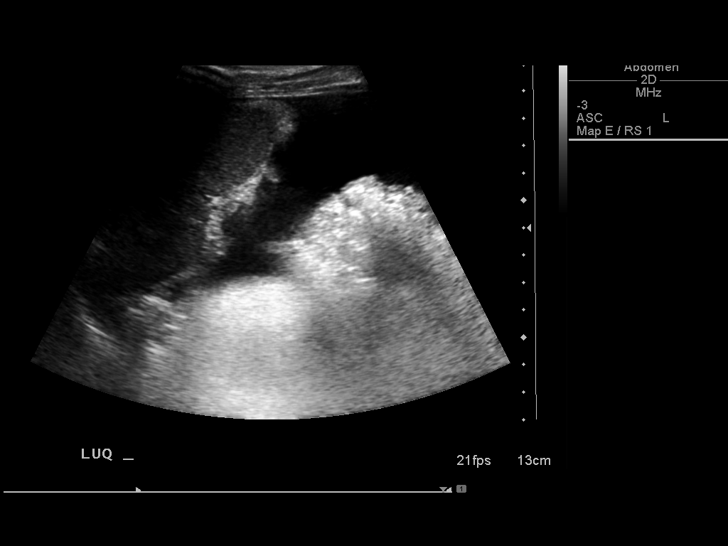
[im 10/12]
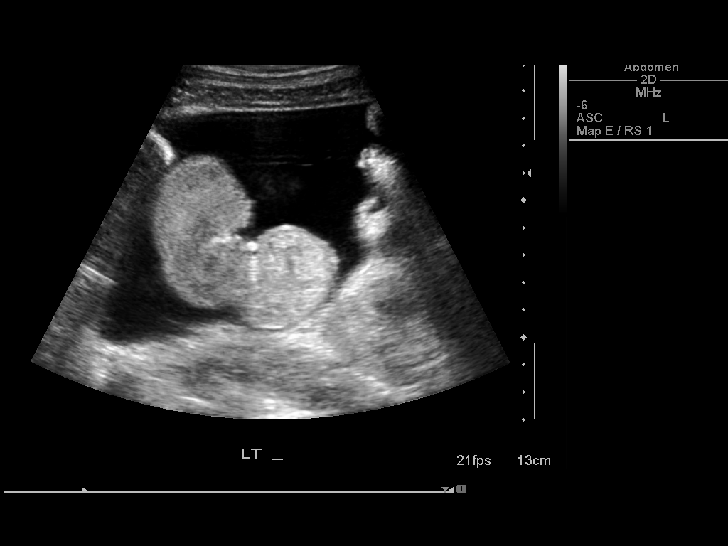
[im 11/12]
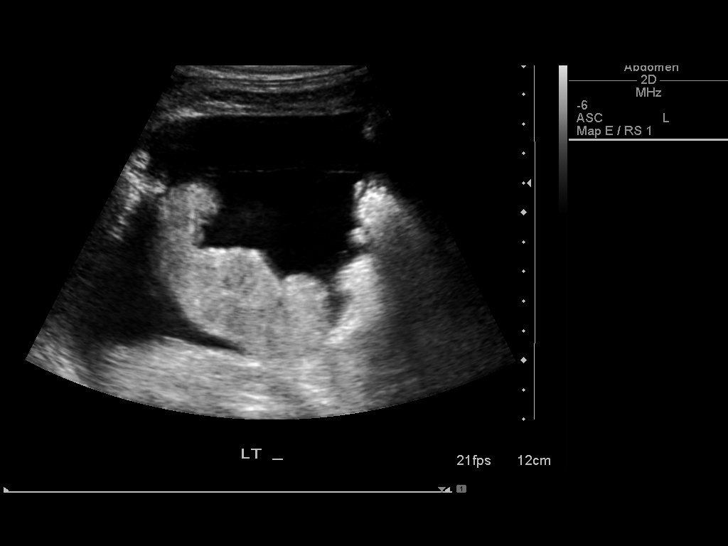
[im 12/12]
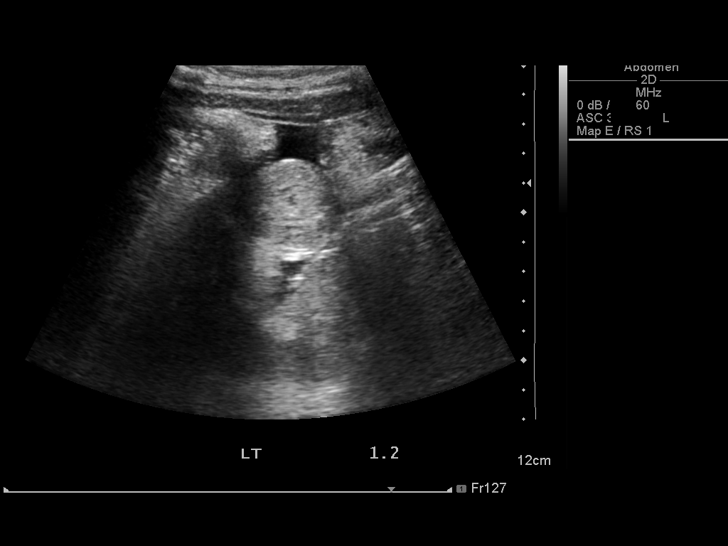

[12 of 12 positions shown; findings below may reference images not displayed]

ULTRASOUND GUIDED THERAPEUTIC  PARACENTESIS

An ultrasound guided paracentesis was thoroughly discussed with the
patient and questions answered.  The benefits, risks, alternatives
and complications were also discussed.  The patient understands and
wishes to proceed with the procedure.  Written consent was
obtained.

Ultrasound was performed to localize and mark an adequate pocket of
fluid in the left lower quadrant of the abdomen.  The area was then
prepped and draped in the normal sterile fashion.  1% Lidocaine was
used for local anesthesia.  Under ultrasound guidance a 19 gauge
Yueh catheter was introduced.  Paracentesis was performed.  The
catheter was removed and a dressing applied.

Complications:  none
FINDINGS: A total of approximately 1.2 liters of yellow fluid was
removed.
IMPRESSION: Successful ultrasound guided therapeutic paracentesis yielding
liters of ascites.

## 2014-01-10 ENCOUNTER — Other Ambulatory Visit: Payer: Self-pay | Admitting: *Deleted

## 2014-07-19 NOTE — Progress Notes (Signed)
This encounter was created in error - please disregard.  This encounter was created in error - please disregard.
